# Patient Record
Sex: Female | Born: 1970
Health system: Southern US, Community
[De-identification: ages and names within clinical notes are randomized; demographics above are authoritative.]

## PROBLEM LIST (undated history)

## (undated) HISTORY — PX: LAPAROSCOPIC GASTRIC BYPASS: SUR771

---
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
## 1998-06-06 HISTORY — PX: CHOLECYSTECTOMY: SHX55

## 1998-07-07 HISTORY — PX: TONSILLECTOMY: SUR1361

## 2004-08-25 ENCOUNTER — Ambulatory Visit: Payer: Self-pay | Admitting: Otolaryngology

## 2005-01-03 ENCOUNTER — Ambulatory Visit: Payer: Self-pay | Admitting: Family Medicine

## 2005-01-11 ENCOUNTER — Ambulatory Visit: Payer: Self-pay | Admitting: Family Medicine

## 2005-01-24 ENCOUNTER — Ambulatory Visit: Payer: Self-pay | Admitting: Family Medicine

## 2005-06-06 HISTORY — PX: ENDOMETRIAL ABLATION: SHX621

## 2005-11-18 ENCOUNTER — Ambulatory Visit: Payer: Self-pay | Admitting: Obstetrics and Gynecology

## 2007-01-22 ENCOUNTER — Ambulatory Visit: Payer: Self-pay | Admitting: Obstetrics and Gynecology

## 2007-07-19 IMAGING — RF DG BARIUM SWALLOW
1 series · 15 of 23 positions shown · non-contrast
Comparison: none

REASON FOR EXAM: Esophageal reflux
COMMENTS:

[Series 1: run · 13 acquisitions, 15 frames shown]
[im 1/13]
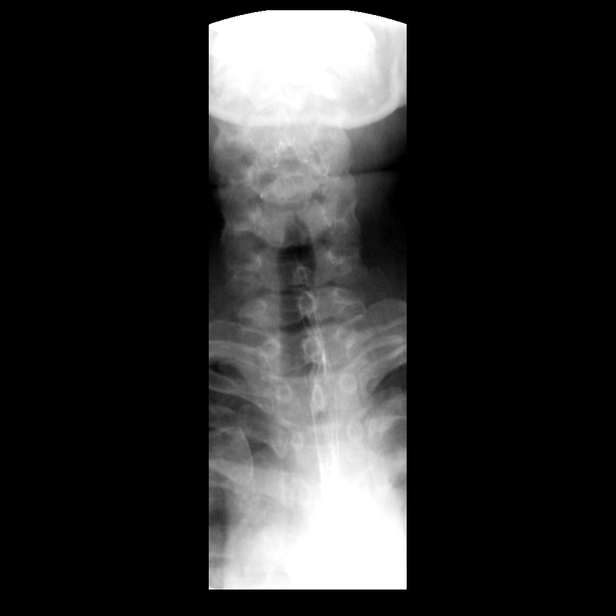
[im 1/13]
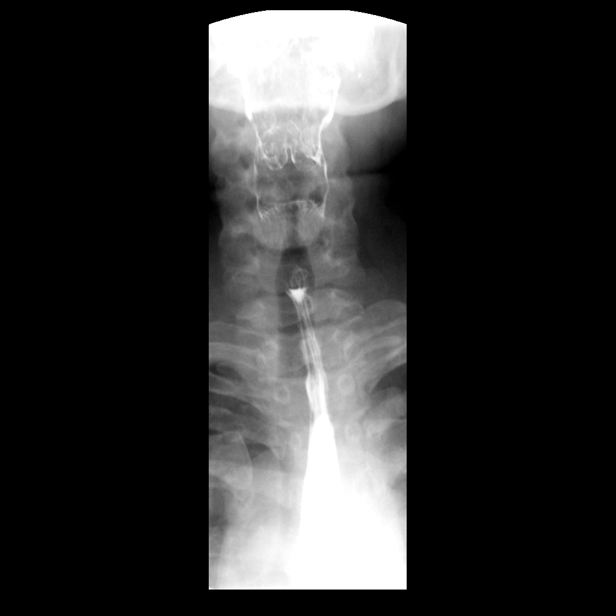
[im 2/13]
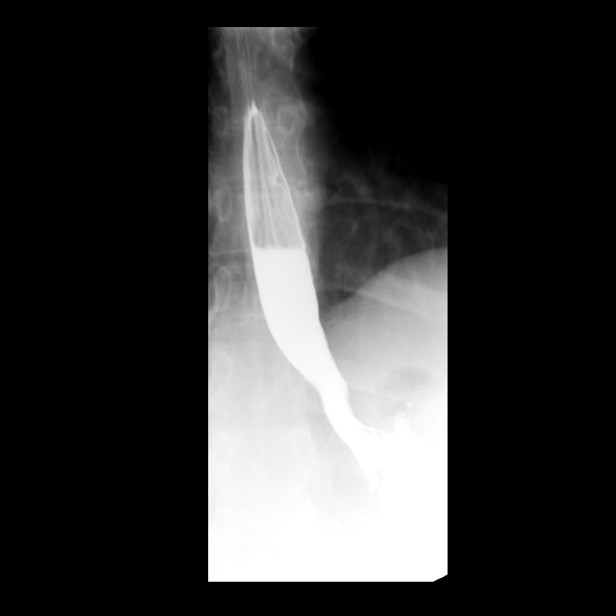
[im 3/13]
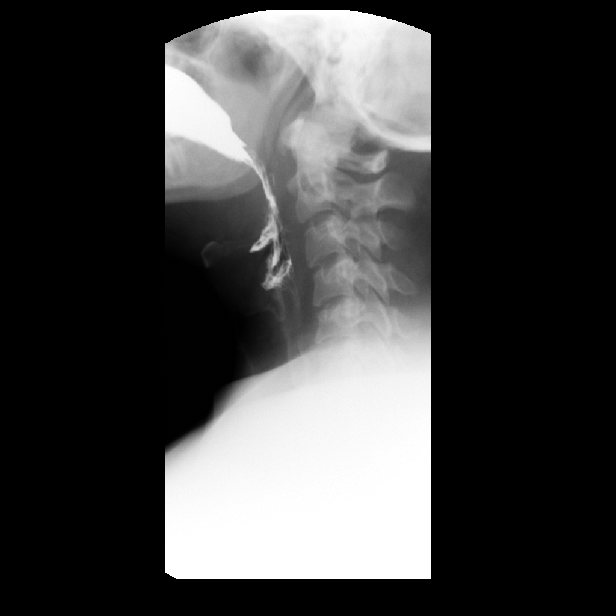
[im 3/13]
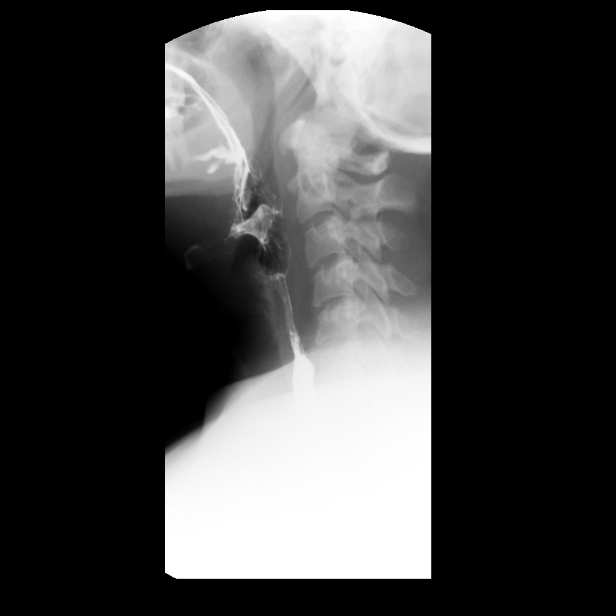
[im 4/13]
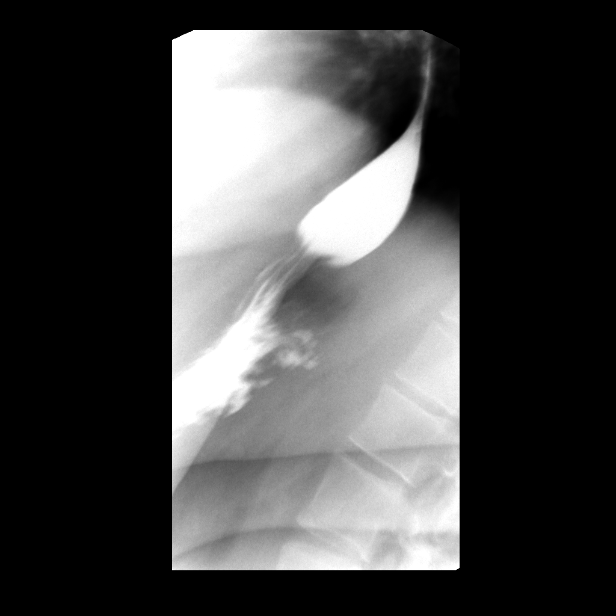
[im 4/13]
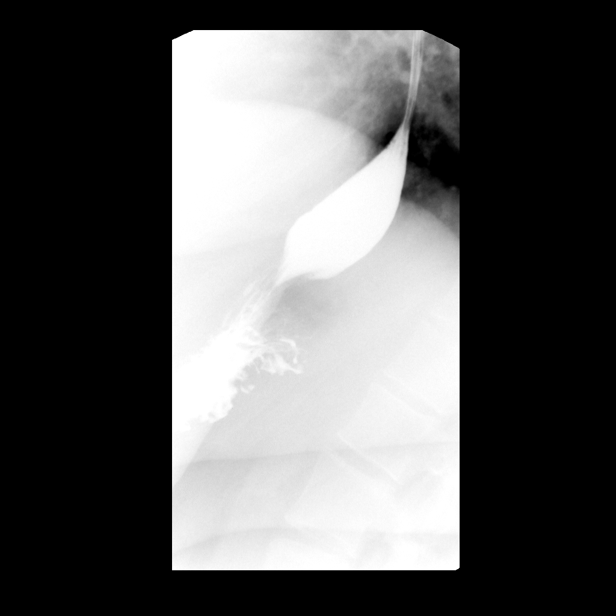
[im 5/13]
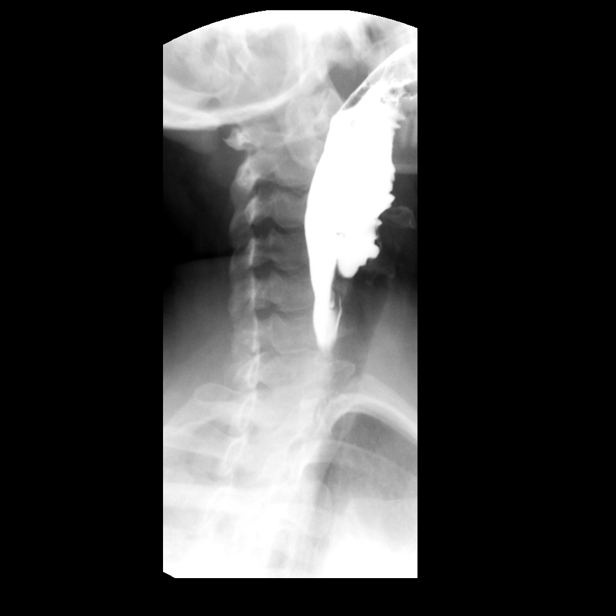
[im 6/13]
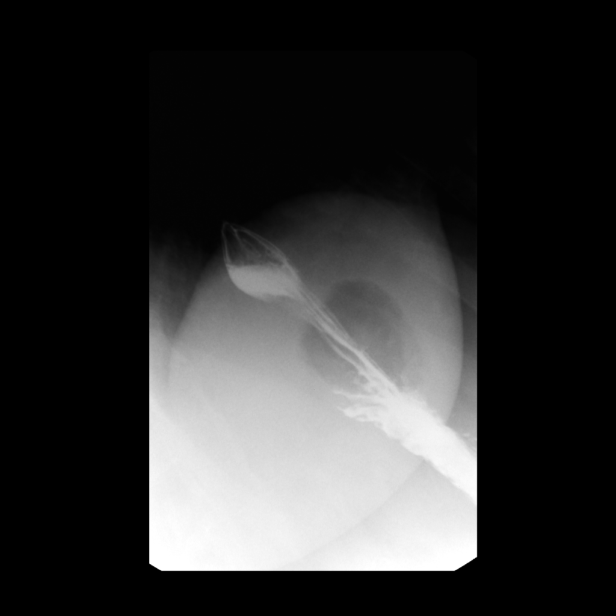
[im 7/13]
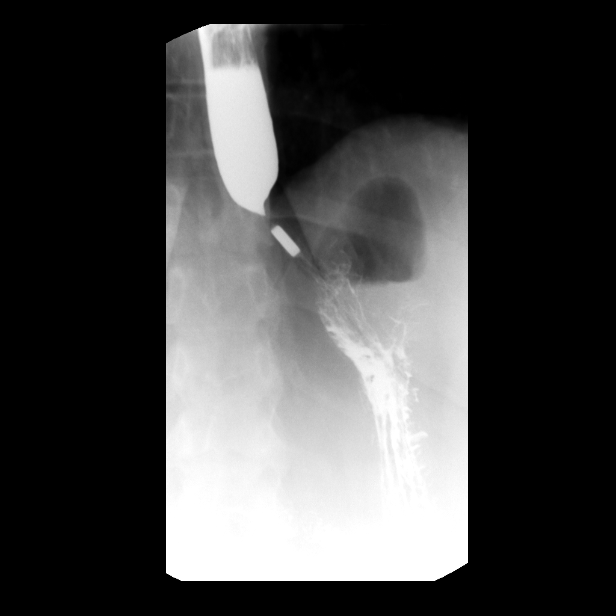
[im 7/13]
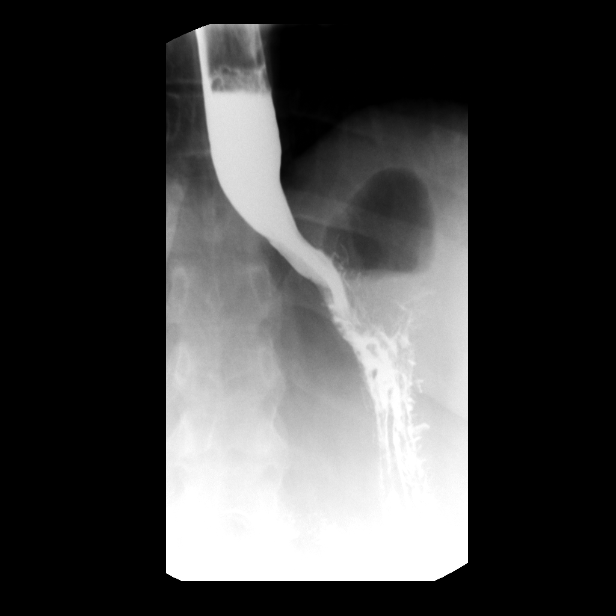
[im 8/13]
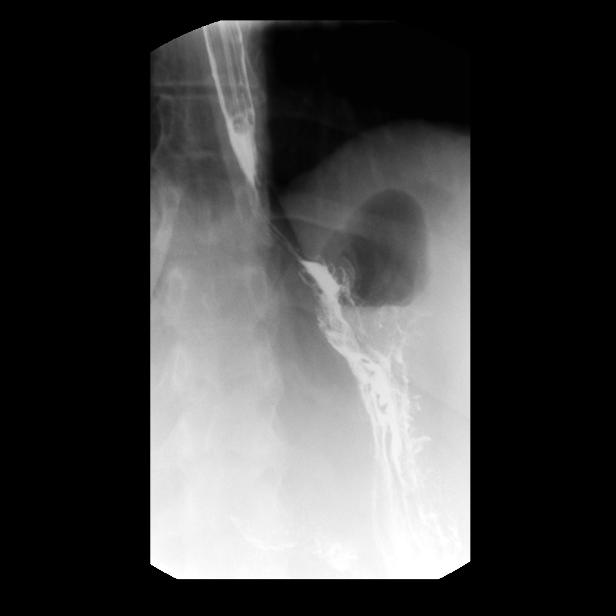
[im 10/13]
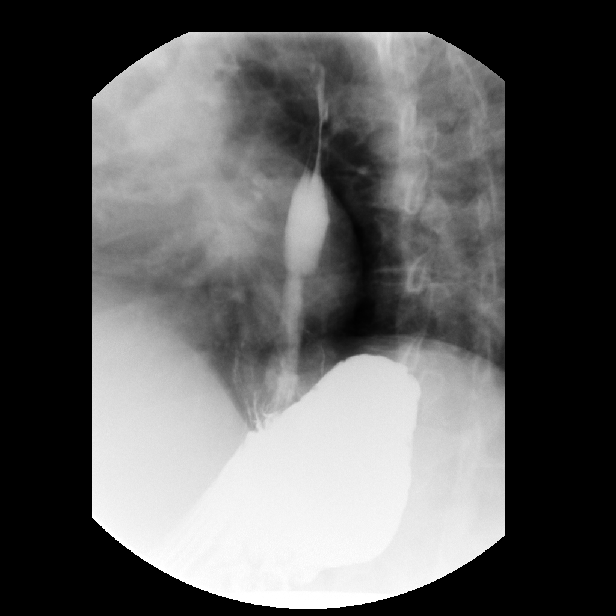
[im 11/13]
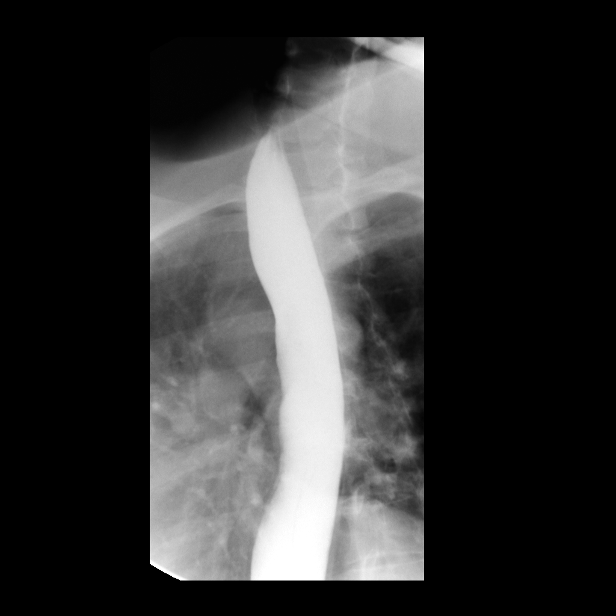
[im 13/13]
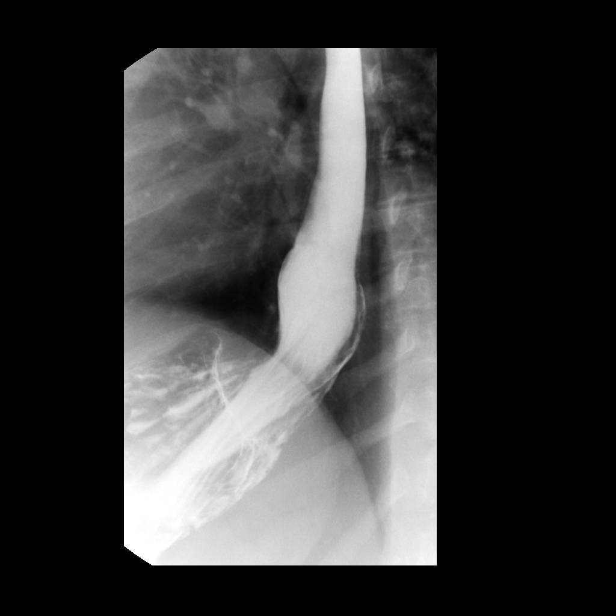

[15 of 23 positions shown; findings below may reference images not displayed]

PROCEDURE:     FL  - FL BARIUM SWALLOW  - January 03, 2005 [DATE]

RESULT:     The patient easily ingested the liquid barium.  The esophageal
mucosa and morphology appear normal.  There is no aspiration or penetration
of barium into the upper airway.  A 12.5 mm barium impregnated tablet passed
easily through the esophagus.  The tablet did pause at the gastroesophageal
junction region and did not pass with additional swallows of water but with
additional swallows of barium the tablet passed into the stomach.  There is
gastroesophageal reflux with the patient in the RIGHT posterior oblique
position into the mid to proximal esophagus.  No hiatal hernia could be
identified.
IMPRESSION: No severe stenosis.  There may be some mild spasm in the distal esophagus.

Findings consistent with gastroesophageal reflux.  No definite hiatal hernia
was seen.

## 2007-07-19 IMAGING — US ABDOMEN ULTRASOUND
1 series · 17 of 25 positions shown · non-contrast
Comparison: none

REASON FOR EXAM: Eval Gallstones Abd Pain
COMMENTS:

[Series 1: abdomen ultrasound · 17 of 59 slices shown]
[im 1/59]
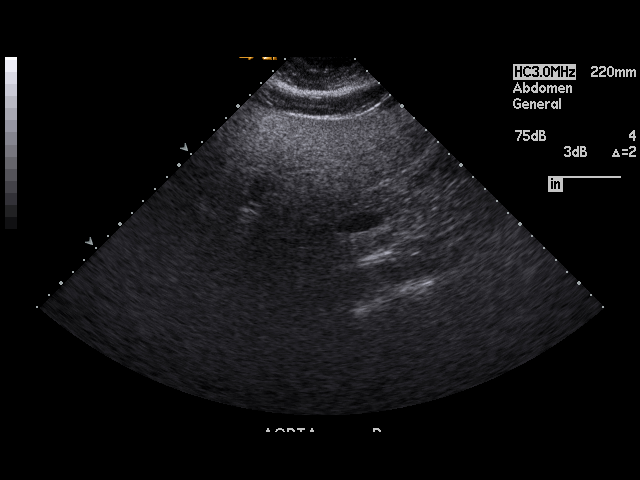
[im 5/59]
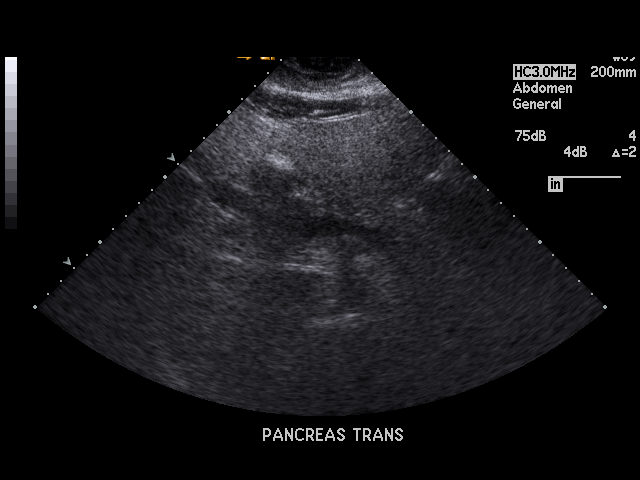
[im 8/59]
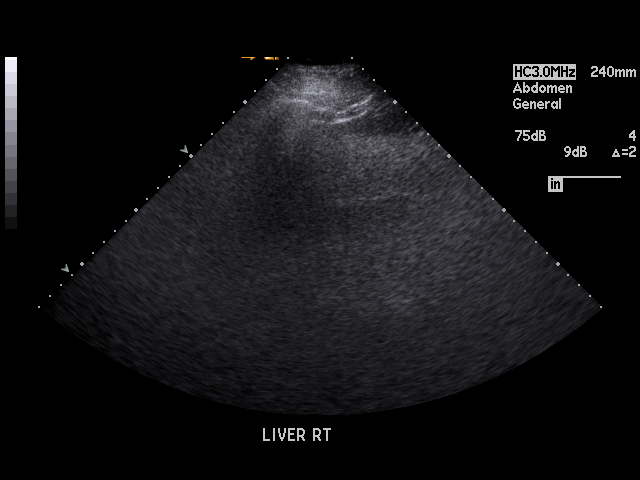
[im 13/59]
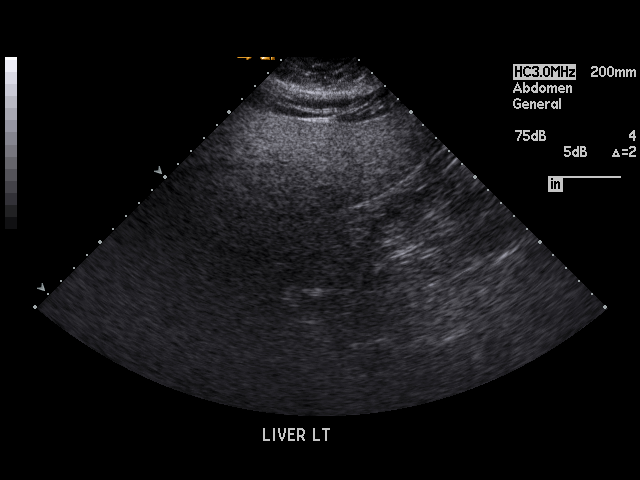
[im 15/59]
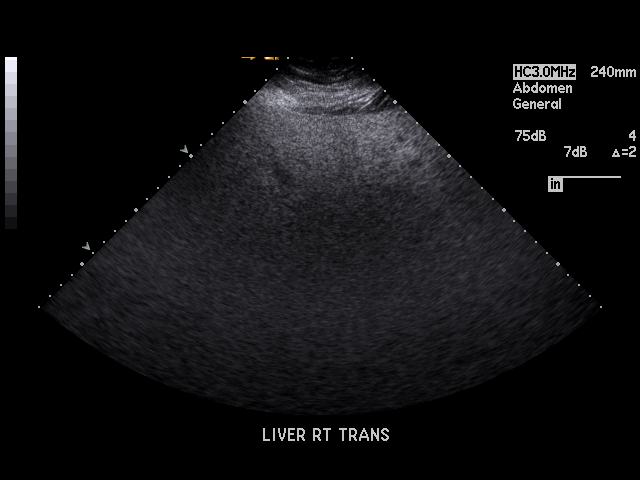
[im 20/59]
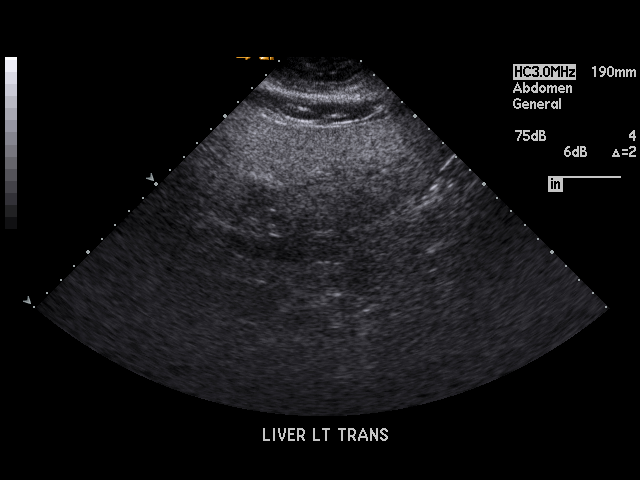
[im 22/59]
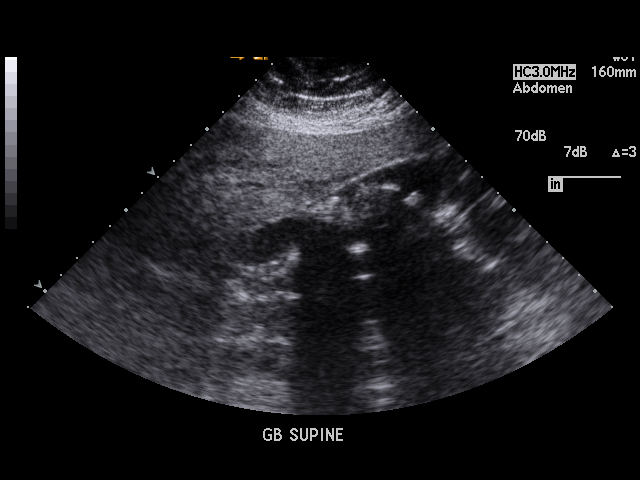
[im 27/59]
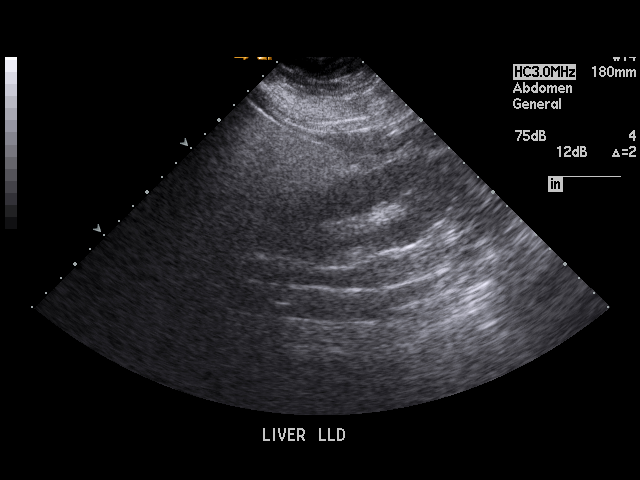
[im 30/59]
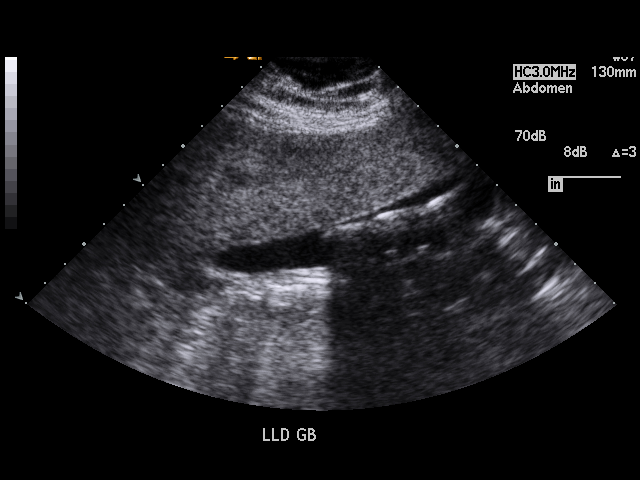
[im 32/59]
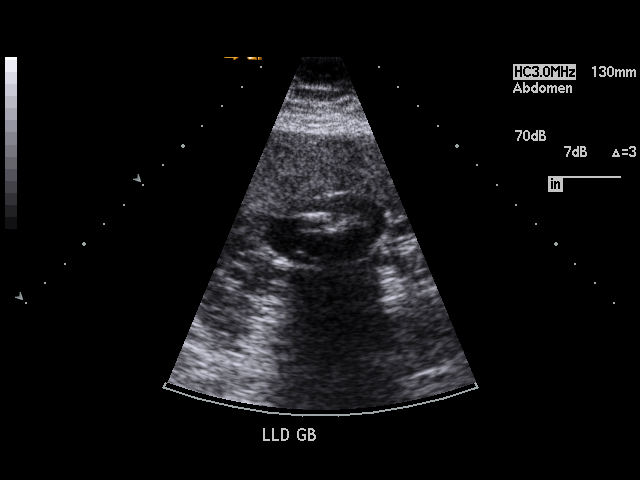
[im 37/59]
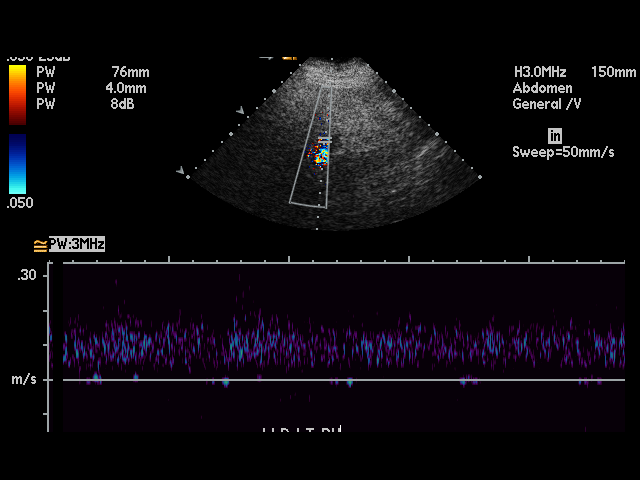
[im 39/59]
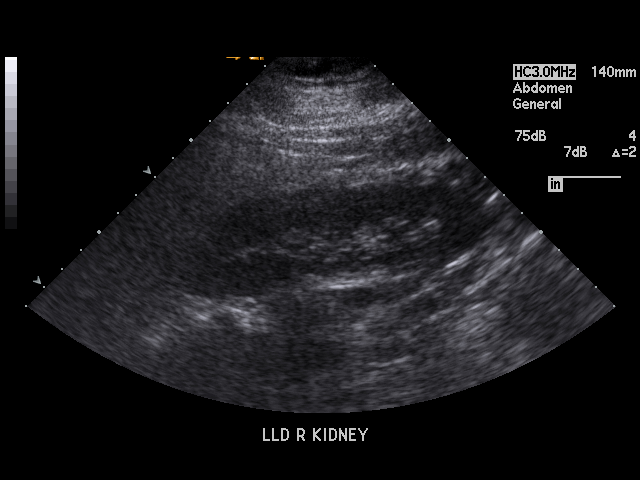
[im 44/59]
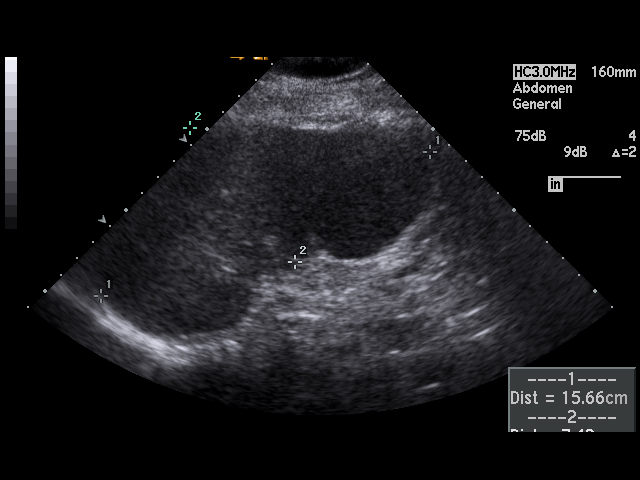
[im 46/59]
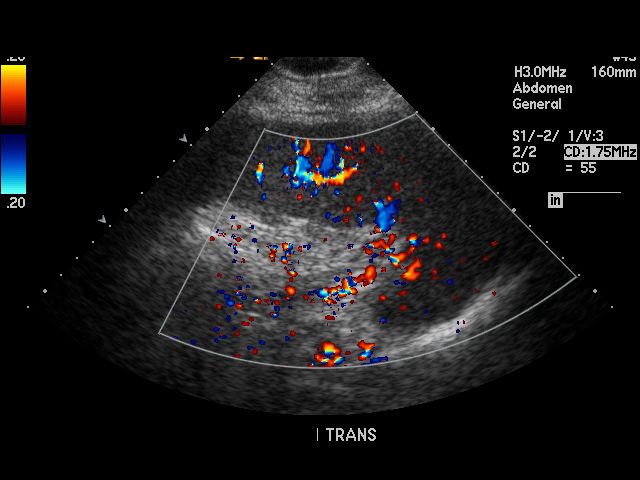
[im 51/59]
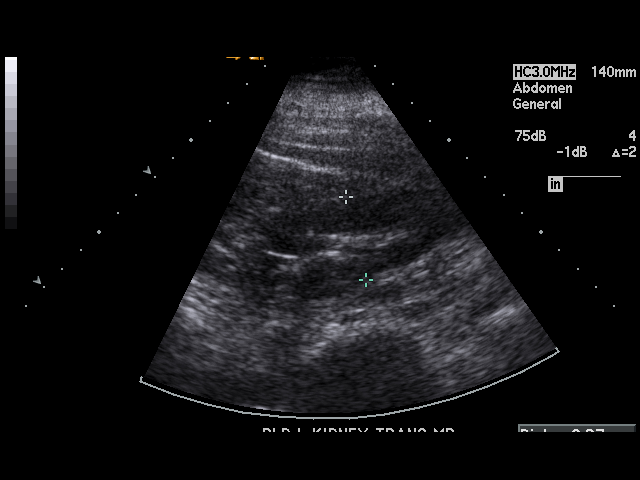
[im 54/59]
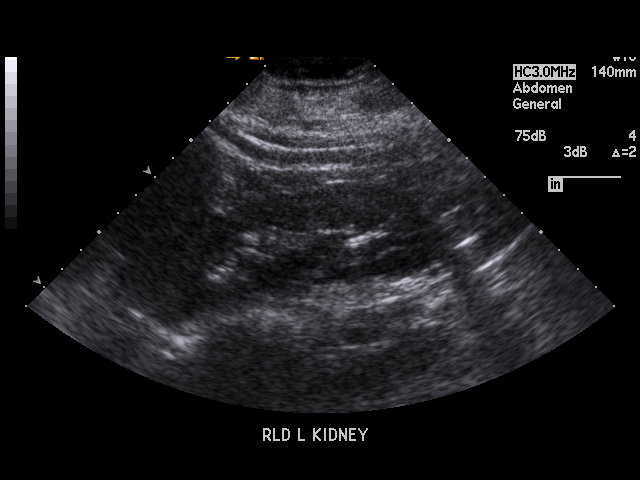
[im 59/59]
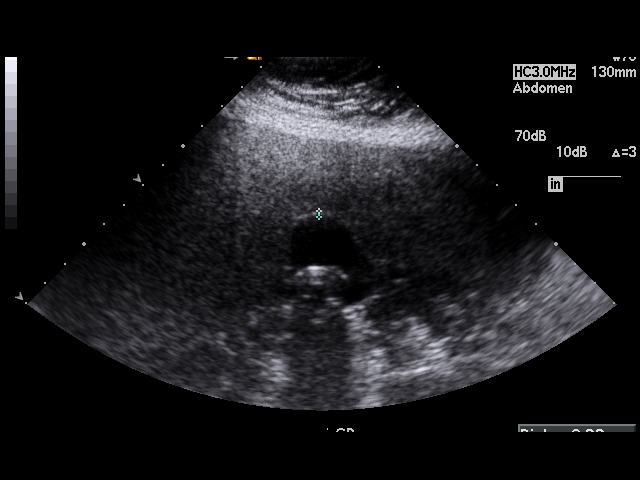

[17 of 25 positions shown; findings below may reference images not displayed]

PROCEDURE:     US  - US ABDOMEN GENERAL SURVEY  - January 03, 2005  [DATE]

RESULT:        Sonographic evaluation of the abdomen is performed in the
standard fashion.   The gallbladder demonstrates multiple stones present.
The patient has a positive sonographic Murphy sign.   Gallbladder wall
thickness is 2.2 mm.  The common bile duct diameter is up to 6.3 mm.   The
kidneys appear to be grossly normal.   The spleen appears to be somewhat
enlarged measuring up to 15.6 cm longitudinally.   No focal splenic masses
are evident.   The liver appears somewhat hyperechoic suggestive of diffuse
fatty infiltration or chronic hepatocellular disease.  The portal vein flow
is normal.   The aorta is limited in visibility but appears grossly normal.
The pancreas could not be seen.
IMPRESSION: 1.     Cholelithiasis.   The patient reports focal tenderness in the RIGHT
upper quadrant during scanning.
2.     The common bile duct is significantly distended measuring up to
mm.
3.     Splenomegaly.
4.     Nonvisualization of the pancreas.
5.     Hyperechogenicity of the liver which may be secondary to diffuse
fatty infiltration.   No focal mass is seen.

## 2013-08-30 ENCOUNTER — Ambulatory Visit: Payer: Self-pay | Admitting: Obstetrics and Gynecology

## 2013-09-09 ENCOUNTER — Ambulatory Visit: Payer: Self-pay | Admitting: Obstetrics and Gynecology

## 2013-09-10 LAB — PATHOLOGY REPORT

## 2014-05-15 ENCOUNTER — Ambulatory Visit: Payer: Self-pay | Admitting: Obstetrics and Gynecology

## 2016-03-14 IMAGING — US US BREAST*R* LIMITED INC AXILLA
1 series · 5 of 5 positions shown · non-contrast
Comparison: 01/22/2007

ADDENDUM:
Patient returned for possible ultrasound-guided core biopsy of the
right breast at 4 o'clock position 12 cm from the nipple. On repeat
ultrasound evaluation at the time of the ultrasound core biopsy a
definite shadowing mass could not be seen. Most likely the
appearance noted on the recent ultrasound was related to shadowing
adjacent to a fat lobule. On my directed physical examination today,
a discrete palpable mass is not present on today's examination and
the area of questionable nodularity on physical examination on
mammography is in an area of fatty tissue. As result, the right
breast ultrasound-guided core biopsy has been canceled. The patient
has been instructed to perform monthly breast self-examination and
contact us if on her physical examination the area becomes more
prominent. I recommend followup right breast diagnostic mammogram
with ultrasound in 6 months.

3: Probably benign finding(s) - short interval follow-up suggested.
CLINICAL DATA: 43-year-old female for annual bilateral mammograms
and palpable mass in the inner lower right breast discovered on
self-examination.
EXAM:
DIGITAL DIAGNOSTIC  BILATERAL MAMMOGRAM WITH CAD
ULTRASOUND RIGHT BREAST

[Series 1: us breast*right* limited inc axilla · 0.08mm/px · 5 of 5 slices shown]
[im 1/5]
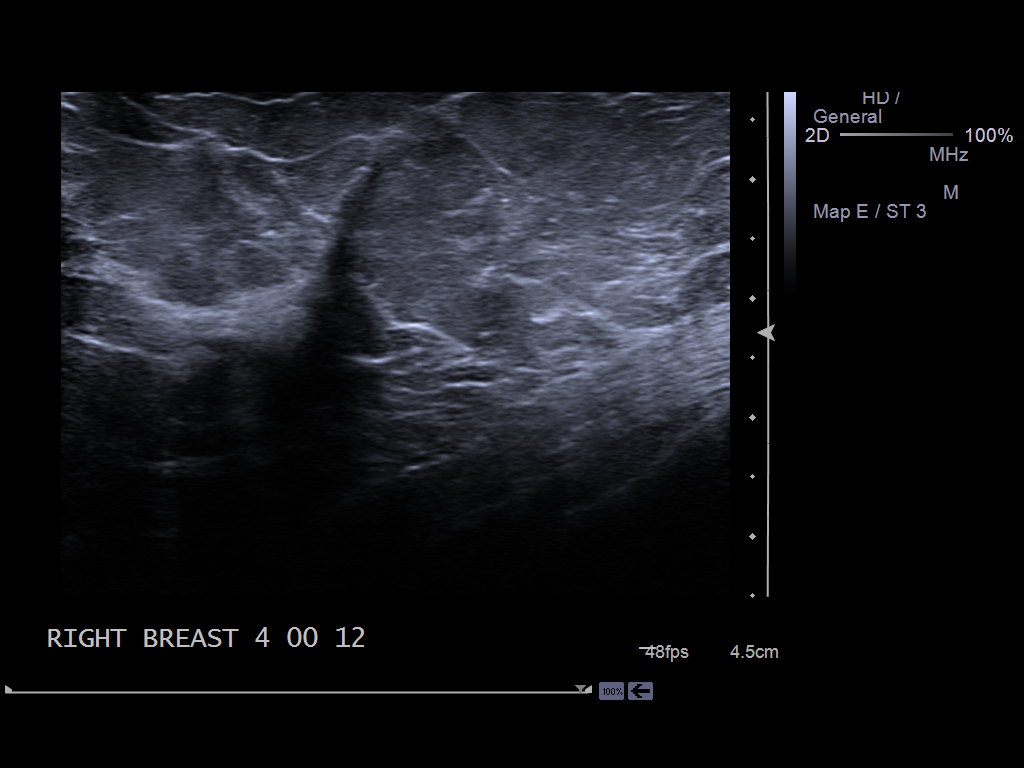
[im 2/5]
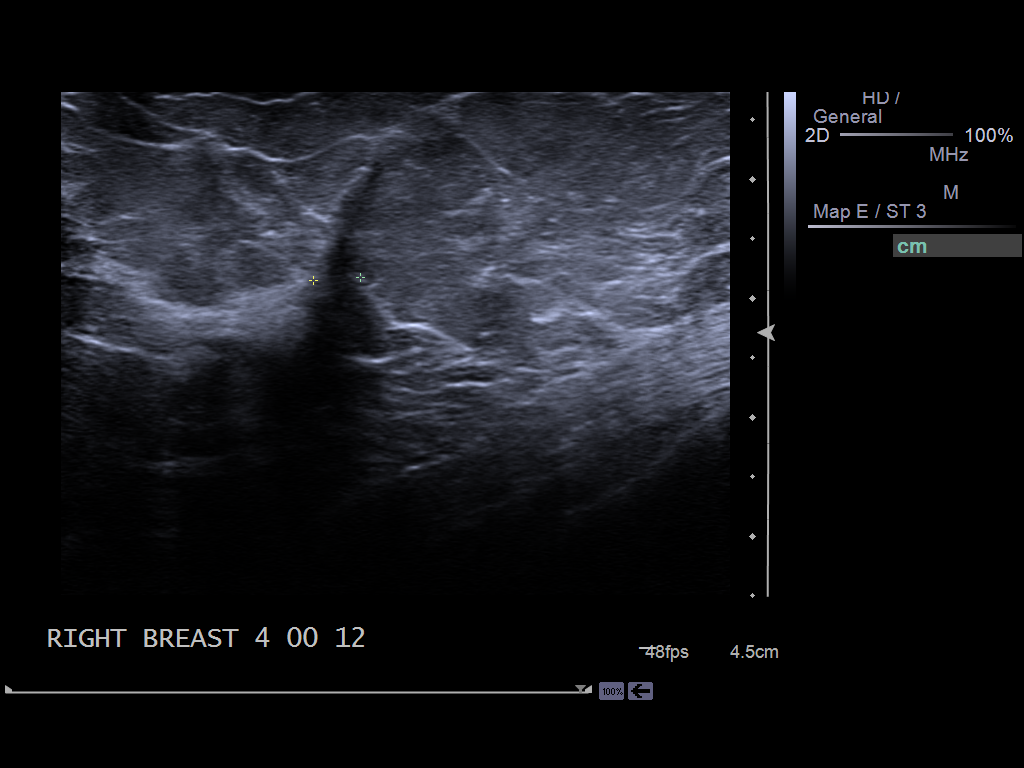
[im 3/5]
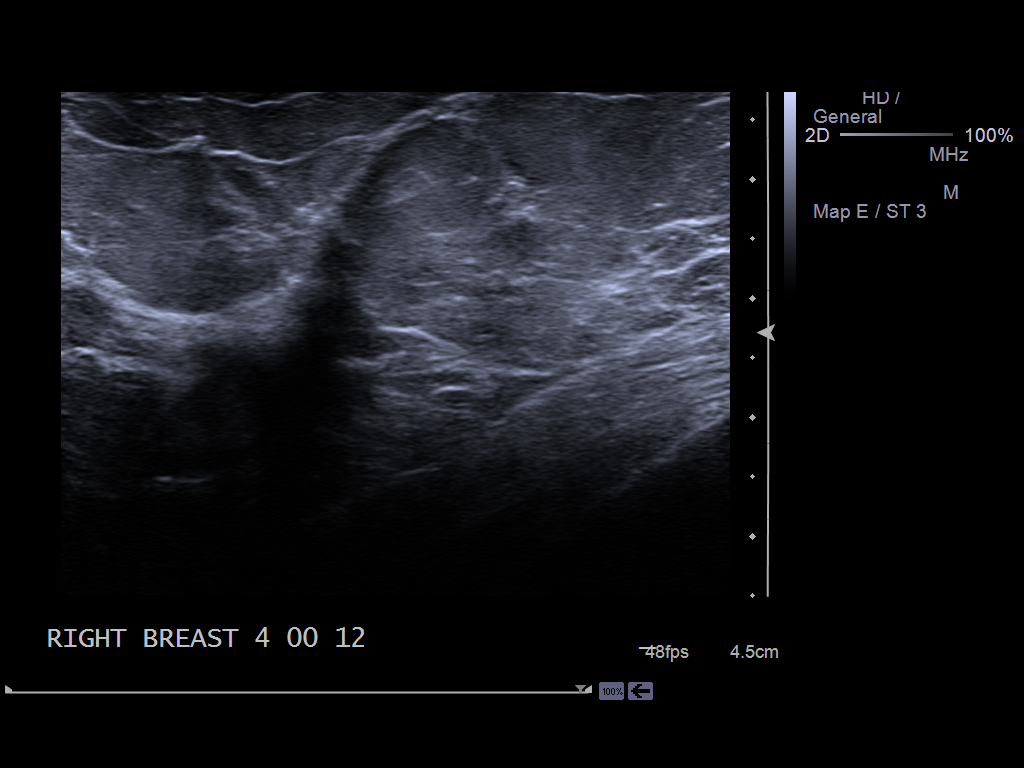
[im 4/5]
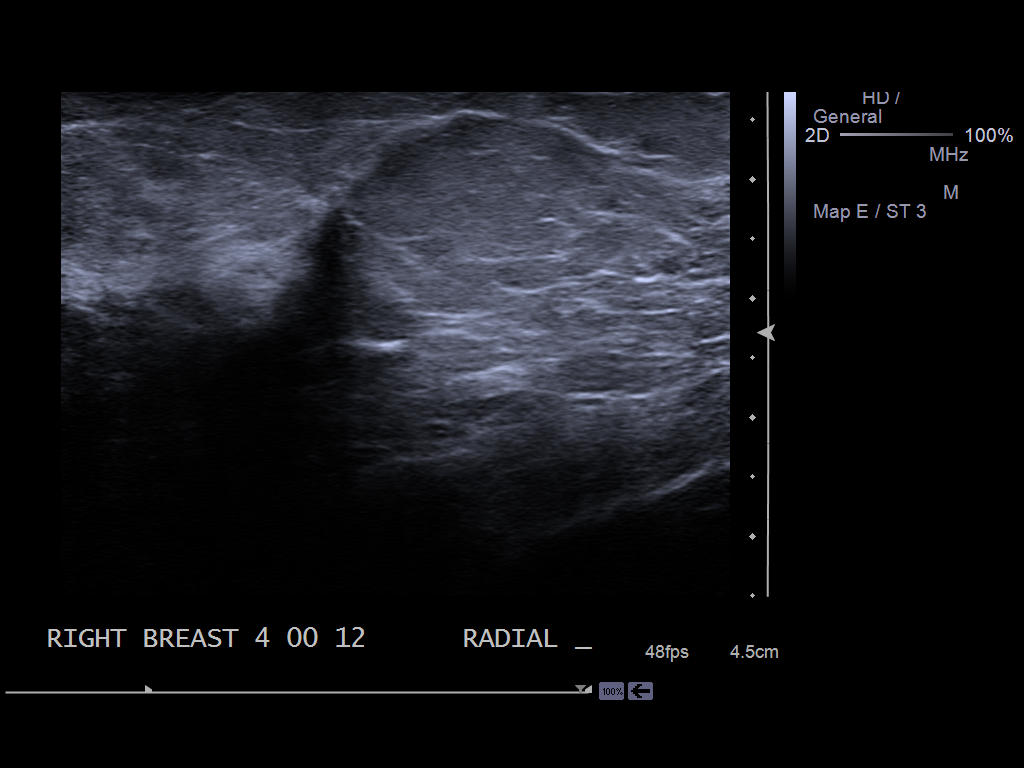
[im 5/5]
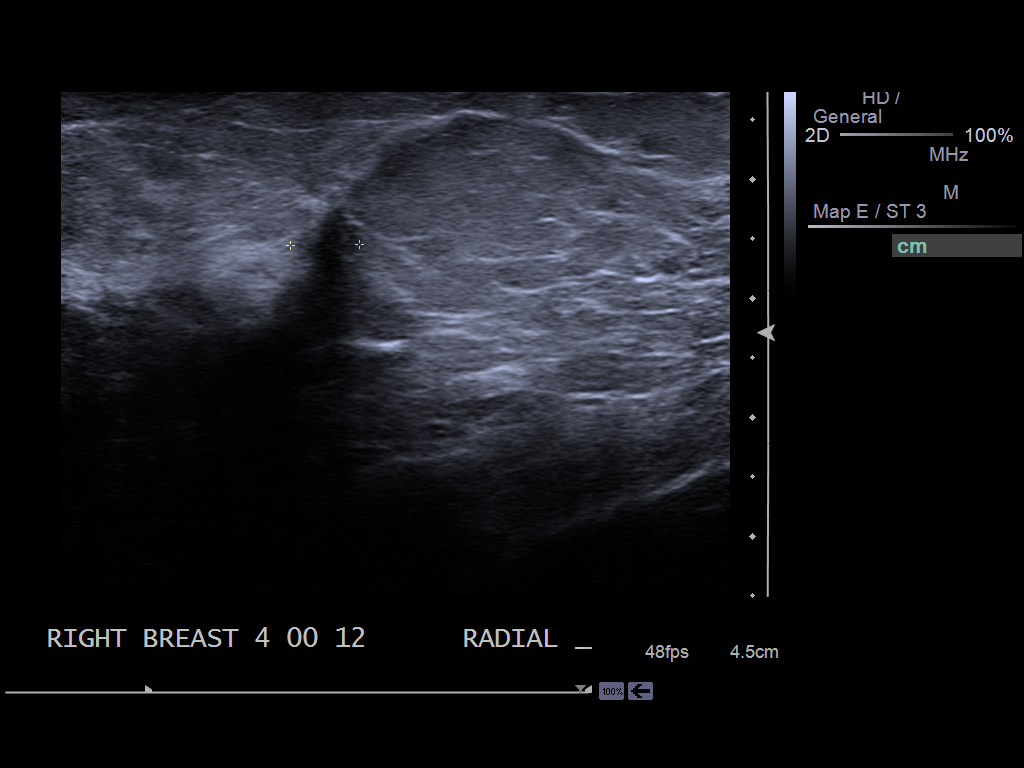

[5 of 5 positions shown; findings below may reference images not displayed]

ACR Breast Density Category c: The breast tissue is heterogeneously
dense, which may obscure small masses.
FINDINGS: Spot compression views of the right breast, magnification views of
the left breast and routine views of both breasts performed.

A new 2 x 5 x 2 mm group of heterogeneous calcifications is
identified within the central left breast.

There is no evidence of suspicious mass or distortion bilaterally.

Mammographic images were processed with CAD.

On physical exam, there is a discrete focal firm 6 mm mass at the 4
o'clock position of the right breast 12 cm from the nipple.

Ultrasound is performed, showing a 6 mm area of hypoechoic shadowing
at the 4 o'clock position of the right breast 12 cm from the nipple,
directly underlying the palpable mass. There are other areas of
hypoechoic shadowing within the right breast that have a similar
appearance and likely represent normal tissue. However, given that
this area at the 4 o'clock position corresponds to the palpable
nodule, tissue sampling is recommended.
IMPRESSION: 6 mm hypoechoic shadowing within the inner lower right breast which
directly corresponds to a palpable mass. Tissue sampling is
recommended.

New indeterminate central left breast calcifications -stereotactic
biopsy is recommended.

RECOMMENDATION:
Ultrasound-guided right breast biopsy and stereotactic guided left
breast biopsy. These procedures will be arranged by our department
and the patient informed.

I have discussed the findings and recommendations with the patient.
Results were also provided in writing at the conclusion of the
visit. If applicable, a reminder letter will be sent to the patient
regarding the next appointment.

BI-RADS CATEGORY  4: Suspicious abnormality - biopsy should be
considered.

## 2016-05-12 ENCOUNTER — Other Ambulatory Visit: Payer: Self-pay | Admitting: Obstetrics and Gynecology

## 2016-05-12 DIAGNOSIS — Z1231 Encounter for screening mammogram for malignant neoplasm of breast: Secondary | ICD-10-CM

## 2016-06-17 ENCOUNTER — Ambulatory Visit: Payer: Self-pay | Attending: Obstetrics and Gynecology

## 2016-11-07 ENCOUNTER — Encounter: Payer: Self-pay | Admitting: Emergency Medicine

## 2016-11-07 ENCOUNTER — Emergency Department
Admission: EM | Admit: 2016-11-07 | Discharge: 2016-11-07 | Disposition: A | Discharge status: Home / Self Care | Payer: 59 | Attending: Emergency Medicine | Admitting: Emergency Medicine

## 2016-11-07 DIAGNOSIS — R197 Diarrhea, unspecified: Secondary | ICD-10-CM | POA: Insufficient documentation

## 2016-11-07 DIAGNOSIS — R112 Nausea with vomiting, unspecified: Secondary | ICD-10-CM | POA: Diagnosis present

## 2016-11-07 LAB — COMPREHENSIVE METABOLIC PANEL
ALK PHOS: 66 U/L (ref 38–126)
ALT: 15 U/L (ref 14–54)
AST: 18 U/L (ref 15–41)
Albumin: 4.3 g/dL (ref 3.5–5.0)
Anion gap: 10 (ref 5–15)
BILIRUBIN TOTAL: 0.7 mg/dL (ref 0.3–1.2)
BUN: 16 mg/dL (ref 6–20)
CO2: 24 mmol/L (ref 22–32)
Calcium: 9.4 mg/dL (ref 8.9–10.3)
Chloride: 103 mmol/L (ref 101–111)
Creatinine, Ser: 0.73 mg/dL (ref 0.44–1.00)
GFR calc Af Amer: >60 mL/min (ref >60)
GFR calc non Af Amer: >60 mL/min (ref >60)
Glucose, Bld: 138 mg/dL — ABNORMAL HIGH (ref 65–99)
POTASSIUM: 4.1 mmol/L (ref 3.5–5.1)
SODIUM: 137 mmol/L (ref 135–145)
TOTAL PROTEIN: 8.1 g/dL (ref 6.5–8.1)

## 2016-11-07 LAB — URINALYSIS, COMPLETE (UACMP) WITH MICROSCOPIC
Bilirubin Urine: NEGATIVE
Glucose, UA: NEGATIVE mg/dL
Hgb urine dipstick: NEGATIVE
KETONES UR: 5 mg/dL — AB
Leukocytes, UA: NEGATIVE
NITRITE: NEGATIVE
PROTEIN: 30 mg/dL — AB
Specific Gravity, Urine: 1.03 (ref 1.005–1.030)
pH: 5 (ref 5.0–8.0)

## 2016-11-07 LAB — LIPASE, BLOOD: Lipase: 20 U/L (ref 11–51)

## 2016-11-07 LAB — CBC
HEMATOCRIT: 41 % (ref 35.0–47.0)
Hemoglobin: 14.1 g/dL (ref 12.0–16.0)
MCH: 29.2 pg (ref 26.0–34.0)
MCHC: 34.3 g/dL (ref 32.0–36.0)
MCV: 85.2 fL (ref 80.0–100.0)
Platelets: 201 10*3/uL (ref 150–440)
RBC: 4.82 MIL/uL (ref 3.80–5.20)
RDW: 14.9 % — AB (ref 11.5–14.5)
WBC: 13.3 10*3/uL — ABNORMAL HIGH (ref 3.6–11.0)

## 2016-11-07 MED ORDER — SODIUM CHLORIDE 0.9 % IV BOLUS (SEPSIS)
1000.0000 mL | Freq: Once | INTRAVENOUS | Status: AC
  Administered 2016-11-07: 1000 mL via INTRAVENOUS

## 2016-11-07 MED ORDER — ONDANSETRON HCL 4 MG/2ML IJ SOLN
4.0000 mg | Freq: Once | INTRAMUSCULAR | Status: AC
  Administered 2016-11-07: 4 mg via INTRAVENOUS
  Filled 2016-11-07: qty 2

## 2016-11-07 MED ORDER — ONDANSETRON HCL 4 MG PO TABS: 4.0000 mg | ORAL_TABLET | Freq: Three times a day (TID) | ORAL | 0 refills | Status: AC | PRN

## 2016-11-07 MED ORDER — DICYCLOMINE HCL 20 MG PO TABS: 20.0000 mg | ORAL_TABLET | Freq: Three times a day (TID) | ORAL | 0 refills | Status: AC | PRN

## 2016-11-07 NOTE — ED Triage Notes (Signed)
States nausea and belching that began last night, states vomiting and diarrhea and abd "wave like convulsions" since last night, at present states left lower back/flank pain, awake and alert in no acute distress

## 2016-11-07 NOTE — ED Provider Notes (Signed)
[facility] Emergency Department Provider Note   ____________________________________________   I have reviewed the triage vital signs and the nursing notes.   HISTORY  Chief Complaint Vomiting and Diarrhea   History limited by: Not Limited   HPI Nicole Luna is a 46 y.o. female who presents to the emergency department today because of concerns for vomiting diarrhea and abdominal pain. She states that the vomiting and diarrhea started last evening. Started couple hours after meal at [employer]. She is on multiple episodes of nonbloody vomiting and nonbloody diarrhea. Says also been accompanied by some abdominal pain. Is located in the left side. It is described as sharp. It does come and go. She does not have any known sick contacts.   History reviewed. No pertinent past medical history.  There are no active problems to display for this patient.   History reviewed. No pertinent surgical history.  Prior to Admission medications   Not on File    Allergies Penicillins  History reviewed. No pertinent family history.  Social History Social History  Substance Use Topics  . Smoking status: Not on file  . Smokeless tobacco: Not on file  . Alcohol use Not on file    Review of Systems Constitutional: No fever/chills Eyes: No visual changes. ENT: No sore throat. Cardiovascular: Denies chest pain. Respiratory: Denies shortness of breath. Gastrointestinal: Positive for abdominal pain, nausea, vomiting and diarrhea.  Genitourinary: Negative for dysuria. Musculoskeletal: Negative for back pain. Skin: Negative for rash. Neurological: Negative for headaches, focal weakness or numbness.  ____________________________________________   PHYSICAL EXAM:  VITAL SIGNS: ED Triage Vitals  Enc Vitals Group     BP 11/07/16 1407 121/67     Pulse Rate 11/07/16 1407 94     Resp 11/07/16 1407 20     Temp 11/07/16 1407 100.1 F (37.8 C)     Temp  Source 11/07/16 1407 Oral     SpO2 11/07/16 1407 97 %     Weight 11/07/16 1408 227 lb (103 kg)     Height 11/07/16 1408 5\' 4"  (1.626 m)     Head Circumference --      Peak Flow --      Pain Score 11/07/16 1416 9     Pain Loc --      Pain Edu? --      Excl. in GC? --      Constitutional: Alert and oriented. Well appearing and in no distress. Eyes: Conjunctivae are normal.  ENT   Head: Normocephalic and atraumatic.   Nose: No congestion/rhinnorhea.   Mouth/Throat: Mucous membranes are moist.   Neck: No stridor. Hematological/Lymphatic/Immunilogical: No cervical lymphadenopathy. Cardiovascular: Normal rate, regular rhythm.  No murmurs, rubs, or gallops. Respiratory: Normal respiratory effort without tachypnea nor retractions. Breath sounds are clear and equal bilaterally. No wheezes/rales/rhonchi. Gastrointestinal: Soft and slightly tender diffusely.  No rebound. No guarding.  Genitourinary: Deferred Musculoskeletal: Normal range of motion in all extremities. No lower extremity edema. Neurologic:  Normal speech and language. No gross focal neurologic deficits are appreciated.  Skin:  Skin is warm, dry and intact. No rash noted. Psychiatric: Mood and affect are normal. Speech and behavior are normal. Patient exhibits appropriate insight and judgment.  ____________________________________________    LABS (pertinent positives/negatives)  Labs Reviewed  COMPREHENSIVE METABOLIC PANEL - Abnormal; Notable for the following:       Result Value   Glucose, Bld 138 (*)    All other components within normal limits  CBC - Abnormal; Notable for  the following:    WBC 13.3 (*)    RDW 14.9 (*)    All other components within normal limits  URINALYSIS, COMPLETE (UACMP) WITH MICROSCOPIC - Abnormal; Notable for the following:    Color, Urine AMBER (*)    APPearance HAZY (*)    Ketones, ur 5 (*)    Protein, ur 30 (*)    Bacteria, UA RARE (*)    Squamous Epithelial / LPF 0-5 (*)     All other components within normal limits  LIPASE, BLOOD     ____________________________________________   EKG  None  ____________________________________________    RADIOLOGY  None  ____________________________________________   PROCEDURES  Procedures  ____________________________________________   INITIAL IMPRESSION / ASSESSMENT AND PLAN / ED COURSE  Pertinent labs & imaging results that were available during my care of the patient were reviewed by me and considered in my medical decision making (see chart for details).  Patient presented to the emergency department today with his concerns for nausea vomiting and diarrhea. This would think food poisoning or viral illness likely. Patient did feel better after IV fluids and antibiotics. Will discharge home with antiemetics and bentyl.  ____________________________________________   FINAL CLINICAL IMPRESSION(S) / ED DIAGNOSES  Final diagnoses:  Nausea vomiting and diarrhea     Note: This dictation was prepared with Dragon dictation. Any transcriptional errors that result from this process are unintentional     Phineas Semen, MD 11/07/16 2144

## 2016-11-07 NOTE — ED Notes (Signed)
Pt given sprite for PO challenge, pt has tolerated sprite at this time with no emesis occurrence.

## 2016-11-07 NOTE — Discharge Instructions (Signed)
Please seek medical attention for any high fevers, chest pain, shortness of breath, change in behavior, persistent vomiting, bloody stool or any other new or concerning symptoms.  

## 2017-08-22 DIAGNOSIS — M722 Plantar fascial fibromatosis: Secondary | ICD-10-CM | POA: Diagnosis not present

## 2017-09-12 DIAGNOSIS — E119 Type 2 diabetes mellitus without complications: Secondary | ICD-10-CM | POA: Diagnosis not present

## 2018-04-10 DIAGNOSIS — Z0000 Encounter for general adult medical examination without abnormal findings: Secondary | ICD-10-CM | POA: Diagnosis not present

## 2018-04-10 DIAGNOSIS — Z7689 Persons encountering health services in other specified circumstances: Secondary | ICD-10-CM | POA: Diagnosis not present

## 2018-04-10 DIAGNOSIS — Z1231 Encounter for screening mammogram for malignant neoplasm of breast: Secondary | ICD-10-CM | POA: Diagnosis not present

## 2018-04-25 DIAGNOSIS — R7303 Prediabetes: Secondary | ICD-10-CM | POA: Diagnosis not present

## 2018-04-25 DIAGNOSIS — Z0000 Encounter for general adult medical examination without abnormal findings: Secondary | ICD-10-CM | POA: Diagnosis not present

## 2018-04-25 DIAGNOSIS — M722 Plantar fascial fibromatosis: Secondary | ICD-10-CM | POA: Diagnosis not present

## 2018-04-25 DIAGNOSIS — Z23 Encounter for immunization: Secondary | ICD-10-CM | POA: Diagnosis not present

## 2018-06-04 DIAGNOSIS — R030 Elevated blood-pressure reading, without diagnosis of hypertension: Secondary | ICD-10-CM | POA: Diagnosis not present

## 2018-06-25 DIAGNOSIS — R030 Elevated blood-pressure reading, without diagnosis of hypertension: Secondary | ICD-10-CM | POA: Diagnosis not present

## 2018-07-07 DIAGNOSIS — R509 Fever, unspecified: Secondary | ICD-10-CM | POA: Diagnosis not present

## 2018-07-09 DIAGNOSIS — R6889 Other general symptoms and signs: Secondary | ICD-10-CM | POA: Diagnosis not present

## 2018-07-09 DIAGNOSIS — J069 Acute upper respiratory infection, unspecified: Secondary | ICD-10-CM | POA: Diagnosis not present

## 2020-05-06 ENCOUNTER — Encounter: Payer: Self-pay | Admitting: Physical Therapy

## 2020-05-06 ENCOUNTER — Ambulatory Visit: Payer: 59 | Attending: Obstetrics and Gynecology | Admitting: Physical Therapy

## 2020-05-06 ENCOUNTER — Other Ambulatory Visit: Payer: Self-pay

## 2020-05-06 DIAGNOSIS — M6281 Muscle weakness (generalized): Secondary | ICD-10-CM | POA: Insufficient documentation

## 2020-05-06 DIAGNOSIS — R278 Other lack of coordination: Secondary | ICD-10-CM | POA: Diagnosis present

## 2020-05-06 NOTE — Therapy (Signed)
[facility] [address] [place], [place], [address] Phone: [phone]   Fax:  [phone]  Physical Therapy Evaluation  Patient Details  Name: Nicole Luna [facility] MRN: [id number] Date of Birth: October 03, 1970 Referring Provider (PT): Christeen Douglas   Encounter Date: 05/06/2020   PT End of Session - 05/06/20 1610    Visit Number 1    Number of Visits 8    Date for PT Re-Evaluation 07/01/20    Authorization Type IE 05/06/2020    PT Start Time 1600    PT Stop Time 1655    PT Time Calculation (min) 55 min    Activity Tolerance Patient tolerated treatment well    Behavior During Therapy [facility] for tasks assessed/performed           History reviewed. No pertinent past medical history.  Past Surgical History:  Procedure Laterality Date  . CHOLECYSTECTOMY  2000  . ENDOMETRIAL ABLATION  2007  . KNEE ARTHROSCOPY  1980  . LAPAROSCOPIC GASTRIC BYPASS    . TONSILLECTOMY  07/1998    There were no vitals filed for this visit.      [facility] PT Assessment - 05/06/20 1607      Assessment   Medical Diagnosis Anterior Vaginal Wall Prolapse    Referring Provider (PT) Christeen Douglas    Onset Date/Surgical Date 06/21/19    Hand Dominance Right    Next MD Visit 03/2021    Prior Therapy None for this dx      Precautions   Precautions Other (comment)   Prolapse     Balance Screen   Has the patient fallen in the past 6 months No             PELVIC HEALTH PHYSICAL THERAPY EVALUATION  SCREENING Red Flags: None Have you had any night sweats? Unexplained weight loss? Saddle anesthesia? Unexplained changes in bowel or bladder habits?  Precautions: None  SUBJECTIVE  Chief Complaint: Patient notes increased muscle cramping throughout body of longstanding duration. Patient notes that she has some minor complaints for urinary incontinence and urgency with standing after prolonged sitting. Patient's main complaint is the feeling of  a bulge when showering and with forward flexion motions during ADLs.  Pertinent History:  Falls Positive for fall on coccyx.  Scoliosis Positive for kyphosis. Pulmonary disease/dysfunction Negative. Surgical history: Positive for see above.   Recent Procedures/Tests/Findings: None of late  Obstetrical History: G2P2 Deliveries: vaginal, long labors with extended pushing Tearing/Episiotomy: G1 episiotomy Birthing position: back  Gynecological History: Hysterectomy: No  Endometriosis: Positive.  Last Menstrual Period: not presently menstruating; occasional spotting Pain with exam: No   Urinary History: Incontinence: Positive for stress and infrequent urge. Onset: 2021 Triggers: coughing/laughing/sneezing; prolonged delaying. Amount: Min.  Fluid Intake: 100 oz H20,  16 oz coffee caffeinated, infrequent juice/milk Nocturia: 1-2x/night Frequency of urination: every 1 hour Pain with urination: Positive for pain with prolonged delaying (dull) Difficulty initiating urination: Positive for prolonged delaying. Makes use of Crede's maneuver for complete emptying. Frequent UTI: Negative. Hx of frequent kidney infections in 20-30s.  Gastrointestinal History: [place] Stool Chart: Type 2-3 Frequency of BMs: 2-3x/week with no regularity Pain with defecation: Negative Straining with defecation: Positive for "sometimes"  Sexual activity/pain: Pain with intercourse: Positive for occasional depending on angle.   Initial penetration: No  Deep thrustingYes   Location of pain: no pain at present Current pain:  0/10   Patient Goals:  Prevent progression of prolapse  OBJECTIVE  Mental Status Patient  is oriented to person, place and time.  Recent memory is intact.  Remote memory is intact.  Attention span and concentration are intact.  Expressive speech is intact.  Patient's fund of knowledge is within normal limits for educational level.  POSTURE/OBSERVATIONS:  Lumbar lordosis:  WNL Iliac crest height: appearing equal bilaterally Lumbar lateral shift: appearing negative Pelvic obliquity: appearing negative Leg length discrepancy: appearing negative  GAIT: WNL  RANGE OF MOTION: deferred 2/2 to time constraints   LEFT RIGHT  Lumbar forward flexion (65):      Lumbar extension (30):     Lumbar lateral flexion (25):     Thoracic and Lumbar rotation (30 degrees):       Hip Flexion (0-125):      Hip IR (0-45):     Hip ER (0-45):     Hip Abduction (0-40):     Hip extension (0-15):       STRENGTH: MMT deferred 2/2 to time constraints  RLE LLE  Hip Flexion    Hip Extension    Hip Abduction     Hip Adduction     Hip ER     Hip IR     Knee Extension    Knee Flexion    Dorsiflexion     Plantarflexion (seated)     ABDOMINAL: deferred 2/2 to time constraints Palpation: Diastasis: Scar mobility: Rib flare:  SPECIAL TESTS: deferred 2/2 to time constraints   PHYSICAL PERFORMANCE MEASURES:  STS: WNL   EXTERNAL PELVIC EXAM: deferred 2/2 to time constraints Palpation: Breath coordination: Cued Lengthen: Cued Contraction: Cough:  INTERNAL VAGINAL EXAM: deferred 2/2 to time constraints Introitus Appears:  Skin integrity:  Scar mobility: Strength (PERF):  Symmetry: Palpation: Prolapse:   OUTCOME MEASURES: PFDI Prolapse 8   ASSESSMENT Patient is a 49 year old presenting to clinic with chief complaints of pressure in the pelvis. Upon examination, patient demonstrates deficits in PFM coordination, PFM strength, IAP management as evidenced by increased pressure/bulge with standing forward flexion, urinary urgency with standing after prolonged sitting, UI with coughing/laughing/sneezing. Patient's responses on FOTO outcome measures (8) indicate minimal functional limitations/disability/distress. Patient's progress may be limited due to comorbidities; however, patient's motivation and early intervention are advantageous. Patient was able to achieve  basic understanding of PFM function during today's evaluation and responded positively to educational interventions. Patient will benefit from continued skilled therapeutic intervention to address deficits in PFM coordination, PFM strength, and IAP management in order to prevent progression of POP, increase function, and improve overall QOL.  EDUCATION Patient educated on prognosis, POC, and provided with HEP including: bladder diary. Patient articulated understanding and returned demonstration. Patient will benefit from further education in order to maximize compliance and understanding for long-term therapeutic gains.  TREATMENT Neuromuscular Re-education: Patient educated on primary functions of the pelvic floor including: posture/balance, sexual pleasure, storage and elimination of waste from the body, abdominal cavity closure, and breath coordination.   Objective measurements completed on examination: See above findings.       PT Long Term Goals - 05/07/20 1037      PT LONG TERM GOAL #1   Title Patient will demonstrate independence with HEP in order to maximize therapeutic gains and improve carryover from physical therapy sessions to ADLs in the home and community.    Baseline IE: not initiated    Time 8    Period Weeks    Status New    Target Date 07/01/20      PT LONG TERM GOAL #2  Title Patient will demonstrate independent and coordinated diaphragmatic breathing in supine with a 1:2 breathing pattern for improved down-regulation of the nervous system and improved management of intra-abdominal pressures in order to increase function at home and in the community.    Baseline IE: not demonstrated    Time 8    Period Weeks    Status New    Target Date 07/01/20      PT LONG TERM GOAL #3   Title Patient will demonstrate improved function as evidenced by a score of 0 on FOTO measure for full participation in activities at home and in the community.    Baseline IE: 8    Time 8     Period Weeks    Status New    Target Date 07/01/20      PT LONG TERM GOAL #4   Title Patient will demonstrate circumferential and sequential contraction of >4/5 MMT, > 6 sec hold x10 and 5 consecutive quick flicks with </= 10 min rest between testing bouts, and relaxation of the PFM coordinated with breath for improved management of intra-abdominal pressure and normal bowel and bladder function without the presence of pain nor incontinence in order to improve participation at home and in the community.    Baseline IE: not demonstrated    Time 8    Period Weeks    Status New    Target Date 07/01/20                  Plan - 05/06/20 1611    Clinical Impression Statement Patient is a 49 year old presenting to clinic with chief complaints of pressure in the pelvis. Upon examination, patient demonstrates deficits in PFM coordination, PFM strength, IAP management as evidenced by increased pressure/bulge with standing forward flexion, urinary urgency with standing after prolonged sitting, UI with coughing/laughing/sneezing. Patient's responses on FOTO outcome measures (8) indicate minimal functional limitations/disability/distress. Patient's progress may be limited due to comorbidities; however, patient's motivation and early intervention are advantageous. Patient was able to achieve basic understanding of PFM function during today's evaluation and responded positively to educational interventions. Patient will benefit from continued skilled therapeutic intervention to address deficits in PFM coordination, PFM strength, and IAP management in order to prevent progression of POP, increase function, and improve overall QOL.    Personal Factors and Comorbidities Comorbidity 3+;Age;Education;Behavior Pattern;Time since onset of injury/illness/exacerbation;Past/Current Experience    Comorbidities DM, HTN, arthritis, osteoporosis    Examination-Activity Limitations Lift;Squat;Reach  Overhead;Continence;Transfers    Examination-Participation Restrictions Cleaning;Interpersonal Relationship;Yard Work;Community Activity    Stability/Clinical Decision Making Evolving/Moderate complexity    Clinical Decision Making Moderate    Rehab Potential Fair    PT Frequency 1x / week    PT Duration 8 weeks    PT Treatment/Interventions Spinal Manipulations;Joint Manipulations;Taping;Dry needling;Scar mobilization;Manual techniques;Patient/family education;Therapeutic exercise;Therapeutic activities;Stair training;Balance training;Orthotic Fit/Training;Neuromuscular re-education;Gait training;Electrical Stimulation;Moist Heat;Cryotherapy    PT Next Visit Plan PFM assessment; IAP basics    PT Home Exercise Plan bladder diary    Consulted and Agree with Plan of Care Patient           Patient will benefit from skilled therapeutic intervention in order to improve the following deficits and impairments:  Postural dysfunction, Hypermobility, Decreased strength, Decreased coordination, Decreased activity tolerance, Improper body mechanics, Decreased endurance  Visit Diagnosis: Other lack of coordination  Muscle weakness (generalized)     Problem List There are no problems to display for this patient.  Sheria Lang PT, DPT [phone]  05/07/2020, 10:40  AM  [facility] [address] [place], [place], [address] Phone: [phone]   Fax:  [phone]  Name: Aileana Hodder [facility] MRN: [id number] Date of Birth: November 14, 1970

## 2020-05-13 ENCOUNTER — Other Ambulatory Visit: Payer: Self-pay

## 2020-05-13 ENCOUNTER — Encounter: Payer: Self-pay | Admitting: Physical Therapy

## 2020-05-13 ENCOUNTER — Ambulatory Visit: Payer: 59 | Admitting: Physical Therapy

## 2020-05-13 DIAGNOSIS — R278 Other lack of coordination: Secondary | ICD-10-CM

## 2020-05-13 DIAGNOSIS — M6281 Muscle weakness (generalized): Secondary | ICD-10-CM

## 2020-05-13 NOTE — Therapy (Signed)
[facility] [address] [place], [place], [address] Phone: [phone]   Fax:  [phone]  Physical Therapy Treatment  Patient Details  Name: Nicole Luna [facility] MRN: [id number] Date of Birth: 01/11/1971 Referring Provider (PT): Christeen Douglas   Encounter Date: 05/13/2020   PT End of Session - 05/13/20 1459    Visit Number 2    Number of Visits 8    Date for PT Re-Evaluation 07/01/20    Authorization Type IE 05/06/2020    PT Start Time 1500    PT Stop Time 1555    PT Time Calculation (min) 55 min    Activity Tolerance Patient tolerated treatment well    Behavior During Therapy [facility] for tasks assessed/performed           History reviewed. No pertinent past medical history.  Past Surgical History:  Procedure Laterality Date  . CHOLECYSTECTOMY  2000  . ENDOMETRIAL ABLATION  2007  . KNEE ARTHROSCOPY  1980  . LAPAROSCOPIC GASTRIC BYPASS    . TONSILLECTOMY  07/1998    There were no vitals filed for this visit.   Subjective Assessment - 05/13/20 1500    Subjective Patient presents to clinic with bladder diary. Bladder diary indicates leakage with position changes after prolonged positioning. Patient denies any significant changes since evaluation.    Currently in Pain? No/denies          TREATMENT  Pre-treatment assessment: L IC elevated RANGE OF MOTION:    LEFT RIGHT  Lumbar forward flexion (65):  WNL    Lumbar extension (30): WNL    Lumbar lateral flexion (25):  [facility]  Thoracic and Lumbar rotation (30 degrees):    [facility] WFL  Hip Flexion (0-125):   [facility] WFL  Hip IR (0-45):  [facility] WFL  Hip ER (0-45):  [facility] WFL  Hip Abduction (0-40):  [facility] WFL  Hip extension (0-15):  WFL WFL    STRENGTH: MMT   RLE LLE  Hip Flexion 4 5  Hip Extension 5 4  Hip Abduction  4 4  Hip Adduction  4 4  Hip ER  5 5  Hip IR  5 5  Knee Extension 5 5  Knee Flexion 5 5  Dorsiflexion  5 5  Plantarflexion (seated) 5 5   Neuromuscular  Re-education: Gait with R heel lift in hallway for improved postural awareness Postural re-education/awareness at wall with visual feedback for improved body awareness Seated thoracic extension for improved spinal mobility and postural awareness Seated R mermaid stretch for improved postural awareness Patient educated on log roll technique for improved IAP management with bed mobility.  Patient educated throughout session on appropriate technique and form using multi-modal cueing, HEP, and activity modification. Patient articulated understanding and returned demonstration.  Patient Response to interventions: Comfortable to apply stretches this week  ASSESSMENT Patient presents to clinic with excellent motivation to participate in therapy. Patient demonstrates deficits in PFM coordination, PFM strength, IAP management. Patient able to achieve improved postural awareness with visual feedback and stretches during today's session and responded positively to educational and active interventions. Patient will benefit from continued skilled therapeutic intervention to address remaining deficits in PFM coordination, PFM strength, IAP management in order to increase function and improve overall QOL.   PT Long Term Goals - 05/07/20 1037      PT LONG TERM GOAL #1   Title Patient will demonstrate independence with HEP in order to maximize therapeutic gains and improve carryover from physical  therapy sessions to ADLs in the home and community.    Baseline IE: not initiated    Time 8    Period Weeks    Status New    Target Date 07/01/20      PT LONG TERM GOAL #2   Title Patient will demonstrate independent and coordinated diaphragmatic breathing in supine with a 1:2 breathing pattern for improved down-regulation of the nervous system and improved management of intra-abdominal pressures in order to increase function at home and in the community.    Baseline IE: not demonstrated    Time 8    Period  Weeks    Status New    Target Date 07/01/20      PT LONG TERM GOAL #3   Title Patient will demonstrate improved function as evidenced by a score of 0 on FOTO measure for full participation in activities at home and in the community.    Baseline IE: 8    Time 8    Period Weeks    Status New    Target Date 07/01/20      PT LONG TERM GOAL #4   Title Patient will demonstrate circumferential and sequential contraction of >4/5 MMT, > 6 sec hold x10 and 5 consecutive quick flicks with </= 10 min rest between testing bouts, and relaxation of the PFM coordinated with breath for improved management of intra-abdominal pressure and normal bowel and bladder function without the presence of pain nor incontinence in order to improve participation at home and in the community.    Baseline IE: not demonstrated    Time 8    Period Weeks    Status New    Target Date 07/01/20                 Plan - 05/13/20 1500    Clinical Impression Statement Patient presents to clinic with excellent motivation to participate in therapy. Patient demonstrates deficits in PFM coordination, PFM strength, IAP management. Patient able to achieve improved postural awareness with visual feedback and stretches during today's session and responded positively to educational and active interventions. Patient will benefit from continued skilled therapeutic intervention to address remaining deficits in PFM coordination, PFM strength, IAP management in order to increase function and improve overall QOL.    Personal Factors and Comorbidities Comorbidity 3+;Age;Education;Behavior Pattern;Time since onset of injury/illness/exacerbation;Past/Current Experience    Comorbidities DM, HTN, arthritis, osteoporosis    Examination-Activity Limitations Lift;Squat;Reach Overhead;Continence;Transfers    Examination-Participation Restrictions Cleaning;Interpersonal Relationship;Yard Work;Community Activity    Stability/Clinical Decision Making  Evolving/Moderate complexity    Rehab Potential Fair    PT Frequency 1x / week    PT Duration 8 weeks    PT Treatment/Interventions Spinal Manipulations;Joint Manipulations;Taping;Dry needling;Scar mobilization;Manual techniques;Patient/family education;Therapeutic exercise;Therapeutic activities;Stair training;Balance training;Orthotic Fit/Training;Neuromuscular re-education;Gait training;Electrical Stimulation;Moist Heat;Cryotherapy    PT Next Visit Plan PFM assessment; IAP basics    PT Home Exercise Plan bladder diary    Consulted and Agree with Plan of Care Patient           Patient will benefit from skilled therapeutic intervention in order to improve the following deficits and impairments:  Postural dysfunction, Hypermobility, Decreased strength, Decreased coordination, Decreased activity tolerance, Improper body mechanics, Decreased endurance  Visit Diagnosis: Other lack of coordination  Muscle weakness (generalized)     Problem List There are no problems to display for this patient.  Sheria Lang PT, DPT [phone]  05/13/2020, 6:26 PM  [facility] [address] [place], [place], [address] Phone: [phone]   Fax:  [phone]  Name: Nicole Luna [facility] MRN: [id number] Date of Birth: 25-Dec-1970

## 2020-05-20 ENCOUNTER — Encounter: Payer: Self-pay | Admitting: Physical Therapy

## 2020-05-20 ENCOUNTER — Other Ambulatory Visit: Payer: Self-pay

## 2020-05-20 ENCOUNTER — Ambulatory Visit: Payer: 59 | Admitting: Physical Therapy

## 2020-05-20 DIAGNOSIS — R278 Other lack of coordination: Secondary | ICD-10-CM

## 2020-05-20 DIAGNOSIS — M6281 Muscle weakness (generalized): Secondary | ICD-10-CM

## 2020-05-20 NOTE — Therapy (Signed)
[facility] [address] [place], [place], [address] Phone: [phone]   Fax:  [phone]  Physical Therapy Treatment  Patient Details  Name: Nicole Luna [facility] MRN: [id number] Date of Birth: 1971/05/18 Referring Provider (PT): Christeen Douglas   Encounter Date: 05/20/2020   PT End of Session - 05/20/20 1458    Visit Number 3    Number of Visits 8    Date for PT Re-Evaluation 07/01/20    Authorization Type IE 05/06/2020    PT Start Time 1500    PT Stop Time 1555    PT Time Calculation (min) 55 min    Activity Tolerance Patient tolerated treatment well    Behavior During Therapy [facility] for tasks assessed/performed           History reviewed. No pertinent past medical history.  Past Surgical History:  Procedure Laterality Date   CHOLECYSTECTOMY  2000   ENDOMETRIAL ABLATION  2007   KNEE ARTHROSCOPY  1980   LAPAROSCOPIC GASTRIC BYPASS     TONSILLECTOMY  07/1998    There were no vitals filed for this visit.   Subjective Assessment - 05/20/20 1459    Subjective Patient notes that she feels a little looser in her midback and also notes a sensation of feeling "awake" like she is using muscles. Patient notes UI has been better and cannot recollect any accidents.    Currently in Pain? Yes    Pain Score 0-No pain    Pain Location Back    Pain Orientation Mid    Pain Descriptors / Indicators Aching           TREATMENT  Neuromuscular Re-education: Postural awareness activities  - seated thoracic extension with ball/foam roller/towel roll feedback  - modified down dog at counter for improved thoracic extension  - modified down dog at counter with pelvic tilts and breath coordination  - modified down dog at counter with pelvic tilts (pelvic floor muscle awareness and breath coordination)  - doorway pec stretch with scoliosis correction (RUE at 90, LUE at 30) Patient education on nerve pathways for hypogastric nerve  for improved understanding of impact of posture on bladder function.  Patient educated throughout session on appropriate technique and form using multi-modal cueing, HEP, and activity modification. Patient articulated understanding and returned demonstration.  Patient Response to interventions: Enjoyed stretches  ASSESSMENT Patient presents to clinic with excellent motivation to participate in therapy. Patient demonstrates deficits in PFM coordination, PFM strength, IAP management. Patient achieving good symmetry of spine with RUE bias pec stretch in doorway during today's session and responded positively to educational and active interventions. Patient will benefit from continued skilled therapeutic intervention to address remaining deficits in PFM coordination, PFM strength, IAP management in order to increase function and improve overall QOL.      PT Long Term Goals - 05/07/20 1037      PT LONG TERM GOAL #1   Title Patient will demonstrate independence with HEP in order to maximize therapeutic gains and improve carryover from physical therapy sessions to ADLs in the home and community.    Baseline IE: not initiated    Time 8    Period Weeks    Status New    Target Date 07/01/20      PT LONG TERM GOAL #2   Title Patient will demonstrate independent and coordinated diaphragmatic breathing in supine with a 1:2 breathing pattern for improved down-regulation of the nervous system and improved management of intra-abdominal pressures  in order to increase function at home and in the community.    Baseline IE: not demonstrated    Time 8    Period Weeks    Status New    Target Date 07/01/20      PT LONG TERM GOAL #3   Title Patient will demonstrate improved function as evidenced by a score of 0 on FOTO measure for full participation in activities at home and in the community.    Baseline IE: 8    Time 8    Period Weeks    Status New    Target Date 07/01/20      PT LONG TERM GOAL #4    Title Patient will demonstrate circumferential and sequential contraction of >4/5 MMT, > 6 sec hold x10 and 5 consecutive quick flicks with </= 10 min rest between testing bouts, and relaxation of the PFM coordinated with breath for improved management of intra-abdominal pressure and normal bowel and bladder function without the presence of pain nor incontinence in order to improve participation at home and in the community.    Baseline IE: not demonstrated    Time 8    Period Weeks    Status New    Target Date 07/01/20                 Plan - 05/20/20 1458    Clinical Impression Statement Patient presents to clinic with excellent motivation to participate in therapy. Patient demonstrates deficits in PFM coordination, PFM strength, IAP management. Patient achieving good symmetry of spine with RUE bias pec stretch in doorway during today's session and responded positively to educational and active interventions. Patient will benefit from continued skilled therapeutic intervention to address remaining deficits in PFM coordination, PFM strength, IAP management in order to increase function and improve overall QOL.    Personal Factors and Comorbidities Comorbidity 3+;Age;Education;Behavior Pattern;Time since onset of injury/illness/exacerbation;Past/Current Experience    Comorbidities DM, HTN, arthritis, osteoporosis    Examination-Activity Limitations Lift;Squat;Reach Overhead;Continence;Transfers    Examination-Participation Restrictions Cleaning;Interpersonal Relationship;Yard Work;Community Activity    Stability/Clinical Decision Making Evolving/Moderate complexity    Rehab Potential Fair    PT Frequency 1x / week    PT Duration 8 weeks    PT Treatment/Interventions Spinal Manipulations;Joint Manipulations;Taping;Dry needling;Scar mobilization;Manual techniques;Patient/family education;Therapeutic exercise;Therapeutic activities;Stair training;Balance training;Orthotic  Fit/Training;Neuromuscular re-education;Gait training;Electrical Stimulation;Moist Heat;Cryotherapy    PT Next Visit Plan PFM assessment; IAP basics    PT Home Exercise Plan bladder diary    Consulted and Agree with Plan of Care Patient           Patient will benefit from skilled therapeutic intervention in order to improve the following deficits and impairments:  Postural dysfunction,Hypermobility,Decreased strength,Decreased coordination,Decreased activity tolerance,Improper body mechanics,Decreased endurance  Visit Diagnosis: Other lack of coordination  Muscle weakness (generalized)     Problem List There are no problems to display for this patient.  Sheria Lang PT, DPT [phone]  05/20/2020, 6:07 PM  [facility] [address] [place], [place], [address] Phone: [phone]   Fax:  [phone]  Name: Nicole Luna [facility] MRN: [id number] Date of Birth: [birth year]

## 2020-05-27 ENCOUNTER — Ambulatory Visit: Payer: 59 | Admitting: Physical Therapy

## 2020-05-27 ENCOUNTER — Encounter: Payer: Self-pay | Admitting: Physical Therapy

## 2020-05-27 ENCOUNTER — Other Ambulatory Visit: Payer: Self-pay

## 2020-05-27 DIAGNOSIS — R278 Other lack of coordination: Secondary | ICD-10-CM

## 2020-05-27 DIAGNOSIS — M6281 Muscle weakness (generalized): Secondary | ICD-10-CM

## 2020-05-27 NOTE — Therapy (Signed)
[facility] [address] [place], [place], [address] Phone: [phone]   Fax:  [phone]  Physical Therapy Treatment  Patient Details  Name: Nicole Luna MRN: [id number] Date of Birth: 1971-01-18 Referring Provider (PT): Christeen Douglas   Encounter Date: 05/27/2020   PT End of Session - 05/27/20 1743    Visit Number 4    Number of Visits 8    Date for PT Re-Evaluation 07/01/20    Authorization Type IE 05/06/2020    PT Start Time 1700    PT Stop Time 1755    PT Time Calculation (min) 55 min    Activity Tolerance Patient tolerated treatment well    Behavior During Therapy [facility] for tasks assessed/performed           History reviewed. No pertinent past medical history.  Past Surgical History:  Procedure Laterality Date  . CHOLECYSTECTOMY  2000  . ENDOMETRIAL ABLATION  2007  . KNEE ARTHROSCOPY  1980  . LAPAROSCOPIC GASTRIC BYPASS    . TONSILLECTOMY  07/1998    There were no vitals filed for this visit.   Subjective Assessment - 05/27/20 1701    Subjective Patient notes that she has been enjoying the postural stretches and is working to weave them into her days. Patient did have some UI this morning when stretching into extension while standing.    Currently in Pain? No/denies           TREATMENT  Neuromuscular Re-education: Patient educated extensively on intra-abdominal pressure system and impacts on pressure from, breath patterns, PFM, and core musculature. Patient educated on common times when breath holding occurs: high effort, high thought, high stress, high pain activities. Supine hooklying diaphragmatic breathing with VCs and TCs for downregulation of the nervous system and improved management of IAP Supine hooklying, TrA activation with exhalation. VCs and TCs to decrease compensatory patterns and minimize aggravation of the lumbar paraspinals. Supine hooklying, DRAM reduction with manual  compression and coordinated breath for improved IAP management. "The knack" practice with sit to stand transfers for decreased UI with positional changes.   Patient educated throughout session on appropriate technique and form using multi-modal cueing, HEP, and activity modification. Patient articulated understanding and returned demonstration.  Patient Response to interventions: Comfortable to add DRAM corrective exercises to HEP  ASSESSMENT Patient presents to clinic with excellent motivation to participate in therapy. Patient demonstrates deficits in PFM coordination, PFM strength, IAP management. Patient able to coordinate "the knack" for transfers and completed deep core phase 1 exercises with good form during today's session and responded positively to educational and active interventions. Patient will benefit from continued skilled therapeutic intervention to address remaining deficits in PFM coordination, PFM strength, IAP management in order to increase function and improve overall QOL.       PT Long Term Goals - 05/07/20 1037      PT LONG TERM GOAL #1   Title Patient will demonstrate independence with HEP in order to maximize therapeutic gains and improve carryover from physical therapy sessions to ADLs in the home and community.    Baseline IE: not initiated    Time 8    Period Weeks    Status New    Target Date 07/01/20      PT LONG TERM GOAL #2   Title Patient will demonstrate independent and coordinated diaphragmatic breathing in supine with a 1:2 breathing pattern for improved down-regulation of the nervous system and improved management of intra-abdominal pressures  in order to increase function at home and in the community.    Baseline IE: not demonstrated    Time 8    Period Weeks    Status New    Target Date 07/01/20      PT LONG TERM GOAL #3   Title Patient will demonstrate improved function as evidenced by a score of 0 on FOTO measure for full participation in  activities at home and in the community.    Baseline IE: 8    Time 8    Period Weeks    Status New    Target Date 07/01/20      PT LONG TERM GOAL #4   Title Patient will demonstrate circumferential and sequential contraction of >4/5 MMT, > 6 sec hold x10 and 5 consecutive quick flicks with </= 10 min rest between testing bouts, and relaxation of the PFM coordinated with breath for improved management of intra-abdominal pressure and normal bowel and bladder function without the presence of pain nor incontinence in order to improve participation at home and in the community.    Baseline IE: not demonstrated    Time 8    Period Weeks    Status New    Target Date 07/01/20                 Plan - 05/27/20 1800    Clinical Impression Statement Patient presents to clinic with excellent motivation to participate in therapy. Patient demonstrates deficits in PFM coordination, PFM strength, IAP management. Patient able to coordinate "the knack" for transfers and completed deep core phase 1 exercises with good form during today's session and responded positively to educational and active interventions. Patient will benefit from continued skilled therapeutic intervention to address remaining deficits in PFM coordination, PFM strength, IAP management in order to increase function and improve overall QOL.    Personal Factors and Comorbidities Comorbidity 3+;Age;Education;Behavior Pattern;Time since onset of injury/illness/exacerbation;Past/Current Experience    Comorbidities DM, HTN, arthritis, osteoporosis    Examination-Activity Limitations Lift;Squat;Reach Overhead;Continence;Transfers    Examination-Participation Restrictions Cleaning;Interpersonal Relationship;Yard Work;Community Activity    Stability/Clinical Decision Making Evolving/Moderate complexity    Rehab Potential Fair    PT Frequency 1x / week    PT Duration 8 weeks    PT Treatment/Interventions Spinal Manipulations;Joint  Manipulations;Taping;Dry needling;Scar mobilization;Manual techniques;Patient/family education;Therapeutic exercise;Therapeutic activities;Stair training;Balance training;Orthotic Fit/Training;Neuromuscular re-education;Gait training;Electrical Stimulation;Moist Heat;Cryotherapy    PT Next Visit Plan PFM basics    PT Home Exercise Plan postural stretches, DRAM correction    Consulted and Agree with Plan of Care Patient           Patient will benefit from skilled therapeutic intervention in order to improve the following deficits and impairments:  Postural dysfunction,Hypermobility,Decreased strength,Decreased coordination,Decreased activity tolerance,Improper body mechanics,Decreased endurance  Visit Diagnosis: Other lack of coordination  Muscle weakness (generalized)     Problem List There are no problems to display for this patient.  Sheria Lang PT, DPT [phone]  05/27/2020, 6:07 PM  [facility] [address] [place], [place], [address] Phone: [phone]   Fax:  [phone]  Name: Nicole Luna MRN: [id number] Date of Birth: [birth year]

## 2020-06-03 ENCOUNTER — Encounter: Payer: Self-pay | Admitting: Physical Therapy

## 2020-06-03 ENCOUNTER — Ambulatory Visit: Payer: 59 | Admitting: Physical Therapy

## 2020-06-03 ENCOUNTER — Other Ambulatory Visit: Payer: Self-pay

## 2020-06-03 DIAGNOSIS — R278 Other lack of coordination: Secondary | ICD-10-CM | POA: Diagnosis not present

## 2020-06-03 DIAGNOSIS — M6281 Muscle weakness (generalized): Secondary | ICD-10-CM

## 2020-06-03 NOTE — Therapy (Signed)
[facility] [address] [place], [place], [address] Phone: [phone]   Fax:  [phone]  Physical Therapy Treatment  Patient Details  Name: Nicole Luna [facility] MRN: [id number] Date of Birth: 1970/07/23 Referring Provider (PT): Christeen Douglas   Encounter Date: 06/03/2020   PT End of Session - 06/03/20 1651    Visit Number 5    Number of Visits 8    Date for PT Re-Evaluation 07/01/20    Authorization Type IE 05/06/2020    PT Start Time 1600    PT Stop Time 1655    PT Time Calculation (min) 55 min    Activity Tolerance Patient tolerated treatment well    Behavior During Therapy [facility] for tasks assessed/performed           History reviewed. No pertinent past medical history.  Past Surgical History:  Procedure Laterality Date   CHOLECYSTECTOMY  2000   ENDOMETRIAL ABLATION  2007   KNEE ARTHROSCOPY  1980   LAPAROSCOPIC GASTRIC BYPASS     TONSILLECTOMY  07/1998    There were no vitals filed for this visit.   Subjective Assessment - 06/03/20 1601    Subjective Patient notes that she is still sore posturally but thinks this is a good thing. She is finding herself in stacked posture more often with less thought/focus. Patient notes that work remains high stress and busy, but she is eager to incorporate HEP into her day via work and relaxation time at home.    Currently in Pain? No/denies           TREATMENT  Neuromuscular Re-education: Patient educated extensively on postural impacts to intra-abdominal pressure system.  Standing Pilates Postural Control Hug a Tree, GTB   Serve a Tray, GTB Reverse Hug a Tree, BTB  Reverse Serve a Tray, BTB Seated diaphragmatic breathing with VC and TC PRN    Patient educated throughout session on appropriate technique and form using multi-modal cueing, HEP, and activity modification. Patient articulated understanding and returned demonstration.  Patient Response to  interventions: Patient articulating plan for implementing HEP during workday.  ASSESSMENT Patient presents to clinic with excellent motivation to participate in therapy. Patient demonstrates deficits in PFM coordination, PFM strength, IAP management. Patient with good postural control and proprioception of abdominals with standing Pilates activities during today's session and responded positively to educational and active interventions. Patient will benefit from continued skilled therapeutic intervention to address remaining deficits in PFM coordination, PFM strength, IAP management in order to increase function and improve overall QOL.     PT Long Term Goals - 05/07/20 1037      PT LONG TERM GOAL #1   Title Patient will demonstrate independence with HEP in order to maximize therapeutic gains and improve carryover from physical therapy sessions to ADLs in the home and community.    Baseline IE: not initiated    Time 8    Period Weeks    Status New    Target Date 07/01/20      PT LONG TERM GOAL #2   Title Patient will demonstrate independent and coordinated diaphragmatic breathing in supine with a 1:2 breathing pattern for improved down-regulation of the nervous system and improved management of intra-abdominal pressures in order to increase function at home and in the community.    Baseline IE: not demonstrated    Time 8    Period Weeks    Status New    Target Date 07/01/20  PT LONG TERM GOAL #3   Title Patient will demonstrate improved function as evidenced by a score of 0 on FOTO measure for full participation in activities at home and in the community.    Baseline IE: 8    Time 8    Period Weeks    Status New    Target Date 07/01/20      PT LONG TERM GOAL #4   Title Patient will demonstrate circumferential and sequential contraction of >4/5 MMT, > 6 sec hold x10 and 5 consecutive quick flicks with </= 10 min rest between testing bouts, and relaxation of the PFM coordinated  with breath for improved management of intra-abdominal pressure and normal bowel and bladder function without the presence of pain nor incontinence in order to improve participation at home and in the community.    Baseline IE: not demonstrated    Time 8    Period Weeks    Status New    Target Date 07/01/20                 Plan - 06/03/20 1703    Clinical Impression Statement Patient presents to clinic with excellent motivation to participate in therapy. Patient demonstrates deficits in PFM coordination, PFM strength, IAP management. Patient with good postural control and proprioception of abdominals with standing Pilates activities during today's session and responded positively to educational and active interventions. Patient will benefit from continued skilled therapeutic intervention to address remaining deficits in PFM coordination, PFM strength, IAP management in order to increase function and improve overall QOL.    Personal Factors and Comorbidities Comorbidity 3+;Age;Education;Behavior Pattern;Time since onset of injury/illness/exacerbation;Past/Current Experience    Comorbidities DM, HTN, arthritis, osteoporosis    Examination-Activity Limitations Lift;Squat;Reach Overhead;Continence;Transfers    Examination-Participation Restrictions Cleaning;Interpersonal Relationship;Yard Work;Community Activity    Stability/Clinical Decision Making Evolving/Moderate complexity    Rehab Potential Fair    PT Frequency 1x / week    PT Duration 8 weeks    PT Treatment/Interventions Spinal Manipulations;Joint Manipulations;Taping;Dry needling;Scar mobilization;Manual techniques;Patient/family education;Therapeutic exercise;Therapeutic activities;Stair training;Balance training;Orthotic Fit/Training;Neuromuscular re-education;Gait training;Electrical Stimulation;Moist Heat;Cryotherapy    PT Next Visit Plan PFM basics    PT Home Exercise Plan postural stretches, DRAM correction    Consulted and  Agree with Plan of Care Patient           Patient will benefit from skilled therapeutic intervention in order to improve the following deficits and impairments:  Postural dysfunction,Hypermobility,Decreased strength,Decreased coordination,Decreased activity tolerance,Improper body mechanics,Decreased endurance  Visit Diagnosis: Other lack of coordination  Muscle weakness (generalized)     Problem List There are no problems to display for this patient.  Sheria Lang PT, DPT [phone]  06/03/2020, 5:11 PM  [place] [facility] [address] [place], [place], [address] Phone: [phone]   Fax:  [phone]  Name: Iszabella Hebenstreit [facility] MRN: [id number] Date of Birth: 09/08/1970

## 2020-06-10 ENCOUNTER — Other Ambulatory Visit: Payer: Self-pay

## 2020-06-10 ENCOUNTER — Encounter: Payer: Self-pay | Admitting: Physical Therapy

## 2020-06-10 ENCOUNTER — Ambulatory Visit: Payer: 59 | Attending: Obstetrics and Gynecology | Admitting: Physical Therapy

## 2020-06-10 DIAGNOSIS — R278 Other lack of coordination: Secondary | ICD-10-CM

## 2020-06-10 DIAGNOSIS — M6281 Muscle weakness (generalized): Secondary | ICD-10-CM | POA: Diagnosis present

## 2020-06-11 NOTE — Therapy (Signed)
[facility] [address] [place], [place], [address] Phone: [phone]   Fax:  [phone]  Physical Therapy Treatment  Patient Details  Name: Nicole Luna [facility] MRN: [id number] Date of Birth: 1971/01/24 Referring Provider (PT): Christeen Douglas   Encounter Date: 06/10/2020   PT End of Session - 06/10/20 1720    Visit Number 6    Number of Visits 8    Date for PT Re-Evaluation 07/01/20    Authorization Type IE 05/06/2020    PT Start Time 1700    PT Stop Time 1755    PT Time Calculation (min) 55 min    Activity Tolerance Patient tolerated treatment well    Behavior During Therapy [facility] for tasks assessed/performed           History reviewed. No pertinent past medical history.  Past Surgical History:  Procedure Laterality Date  . CHOLECYSTECTOMY  2000  . ENDOMETRIAL ABLATION  2007  . KNEE ARTHROSCOPY  1980  . LAPAROSCOPIC GASTRIC BYPASS    . TONSILLECTOMY  07/1998    There were no vitals filed for this visit.   Subjective Assessment - 06/10/20 1706    Subjective Patient notes that she had some soreness in her upper abdomen from Pilates standing HEP. Patient states that if she has been sitting in a meeting she will have intense urge on standing, but has not had leakage. She has been drinking 100 oz of water/day.    Currently in Pain? No/denies          TREATMENT  Neuromuscular Re-education: Patient educated on pelvic anatomy for improved understanding of mm function, postural impacts, and PFM proprioception. Supine hooklying diaphragmatic breathing with VCs and TCs for downregulation of the nervous system and improved management of IAP Sidelying, thoracolumbar rotations (open-book) bilaterally with diaphragmatic breathing for improved diaphragmatic and rib cage excursion. VCs and TCs to prevent compensations. Supine hooklying, PFM lengthening with inhalation. VCs and TCs to decrease compensatory patterns and encourage  optimal relaxation of the PFM. Supine hooklying, PFM contractions with exhalation. VCs and TCs to decrease compensatory patterns and encourage activation of the PFM.    Patient educated throughout session on appropriate technique and form using multi-modal cueing, HEP, and activity modification. Patient articulated understanding and returned demonstration.  Patient Response to interventions: Patient comfortable with PFM HEP  ASSESSMENT Patient presents to clinic with excellent motivation to participate in therapy. Patient demonstrates deficits in PFM coordination, PFM strength, IAP management. Patient noted to have quick and coordinated motor control of PFM in supine with strength 3-4/5 MMT for 1 rep max despite decreased awareness of PFM position during today's session and responded positively to educational and active interventions. Patient will benefit from continued skilled therapeutic intervention to address remaining deficits in PFM coordination, PFM strength, IAP management in order to increase function and improve overall QOL.     PT Long Term Goals - 05/07/20 1037      PT LONG TERM GOAL #1   Title Patient will demonstrate independence with HEP in order to maximize therapeutic gains and improve carryover from physical therapy sessions to ADLs in the home and community.    Baseline IE: not initiated    Time 8    Period Weeks    Status New    Target Date 07/01/20      PT LONG TERM GOAL #2   Title Patient will demonstrate independent and coordinated diaphragmatic breathing in supine with a 1:2 breathing pattern for improved down-regulation  of the nervous system and improved management of intra-abdominal pressures in order to increase function at home and in the community.    Baseline IE: not demonstrated    Time 8    Period Weeks    Status New    Target Date 07/01/20      PT LONG TERM GOAL #3   Title Patient will demonstrate improved function as evidenced by a score of 0 on  FOTO measure for full participation in activities at home and in the community.    Baseline IE: 8    Time 8    Period Weeks    Status New    Target Date 07/01/20      PT LONG TERM GOAL #4   Title Patient will demonstrate circumferential and sequential contraction of >4/5 MMT, > 6 sec hold x10 and 5 consecutive quick flicks with </= 10 min rest between testing bouts, and relaxation of the PFM coordinated with breath for improved management of intra-abdominal pressure and normal bowel and bladder function without the presence of pain nor incontinence in order to improve participation at home and in the community.    Baseline IE: not demonstrated    Time 8    Period Weeks    Status New    Target Date 07/01/20                 Plan - 06/10/20 1721    Clinical Impression Statement Patient presents to clinic with excellent motivation to participate in therapy. Patient demonstrates deficits in PFM coordination, PFM strength, IAP management. Patient noted to have quick and coordinated motor control of PFM in supine with strength 3-4/5 MMT for 1 rep max despite decreased awareness of PFM position during today's session and responded positively to educational and active interventions. Patient will benefit from continued skilled therapeutic intervention to address remaining deficits in PFM coordination, PFM strength, IAP management in order to increase function and improve overall QOL.    Personal Factors and Comorbidities Comorbidity 3+;Age;Education;Behavior Pattern;Time since onset of injury/illness/exacerbation;Past/Current Experience    Comorbidities DM, HTN, arthritis, osteoporosis    Examination-Activity Limitations Lift;Squat;Reach Overhead;Continence;Transfers    Examination-Participation Restrictions Cleaning;Interpersonal Relationship;Yard Work;Community Activity    Stability/Clinical Decision Making Evolving/Moderate complexity    Rehab Potential Fair    PT Frequency 1x / week    PT  Duration 8 weeks    PT Treatment/Interventions Spinal Manipulations;Joint Manipulations;Taping;Dry needling;Scar mobilization;Manual techniques;Patient/family education;Therapeutic exercise;Therapeutic activities;Stair training;Balance training;Orthotic Fit/Training;Neuromuscular re-education;Gait training;Electrical Stimulation;Moist Heat;Cryotherapy    PT Next Visit Plan PFM basics    PT Home Exercise Plan postural stretches, DRAM correction    Consulted and Agree with Plan of Care Patient           Patient will benefit from skilled therapeutic intervention in order to improve the following deficits and impairments:  Postural dysfunction,Hypermobility,Decreased strength,Decreased coordination,Decreased activity tolerance,Improper body mechanics,Decreased endurance  Visit Diagnosis: Other lack of coordination  Muscle weakness (generalized)     Problem List There are no problems to display for this patient.  Sheria Lang PT, DPT [phone]  06/11/2020, 12:44 PM   [facility] [address] [place], [place], [address] Phone: [phone]   Fax:  [phone]  Name: Nicole Luna [facility] MRN: [id number] Date of Birth: 21-Nov-1970

## 2020-06-17 ENCOUNTER — Encounter: Payer: Self-pay | Admitting: Physical Therapy

## 2020-06-17 ENCOUNTER — Other Ambulatory Visit: Payer: Self-pay

## 2020-06-17 ENCOUNTER — Ambulatory Visit: Payer: 59 | Admitting: Physical Therapy

## 2020-06-17 DIAGNOSIS — R278 Other lack of coordination: Secondary | ICD-10-CM | POA: Diagnosis not present

## 2020-06-17 DIAGNOSIS — M6281 Muscle weakness (generalized): Secondary | ICD-10-CM

## 2020-06-17 NOTE — Therapy (Signed)
[facility] [address] [place], [place], [address] Phone: [phone]   Fax:  [phone]  Physical Therapy Treatment  Patient Details  Name: Nicole Luna [facility] MRN: [id number] Date of Birth: Aug 28, 1970 Referring Provider (PT): Christeen Douglas   Encounter Date: 06/17/2020   PT End of Session - 06/17/20 1709    Visit Number 7    Number of Visits 8    Date for PT Re-Evaluation 07/01/20    Authorization Type IE 05/06/2020    PT Start Time 1700    PT Stop Time 1755    PT Time Calculation (min) 55 min    Activity Tolerance Patient tolerated treatment well    Behavior During Therapy [facility] for tasks assessed/performed           History reviewed. No pertinent past medical history.  Past Surgical History:  Procedure Laterality Date  . CHOLECYSTECTOMY  2000  . ENDOMETRIAL ABLATION  2007  . KNEE ARTHROSCOPY  1980  . LAPAROSCOPIC GASTRIC BYPASS    . TONSILLECTOMY  07/1998    There were no vitals filed for this visit.   Subjective Assessment - 06/17/20 1703    Subjective Patient notes that things continue to perform exercises and feels things are still improving. She notes she continues to have intense urge upon waking but is largely able to control it in order to get to the toilet. Patient has not had to sit through long meetings so she cannot speak to her ability to suppress urge and control her bladder in that scenario. Patient has lost 12 lbs to date intentionally and feels this is helping with her progress.    Currently in Pain? No/denies           TREATMENT Manual Therapy: Colon massage for improved gut motility with return demonstration.  Neuromuscular Re-education: Supine hooklying diaphragmatic breathing with VCs and TCs for downregulation of the nervous system and improved management of IAP [facility] Supine TrA with coordinated breath Supine heel slides with TrA, coordinated  breath Supine marches with TrA, coordinated breath Supine toe taps with TrA, coordinated breath Supine leg raise with TrA, coordinated breath Countertop birddog progression with coordinated breath  Patient educated throughout session on appropriate technique and form using multi-modal cueing, HEP, and activity modification. Patient articulated understanding and returned demonstration.  Patient Response to interventions: Patient comfortable with additions to HEP  ASSESSMENT Patient presents to clinic with excellent motivation to participate in therapy. Patient demonstrates deficits in PFM coordination, PFM strength, IAP management. Patient able to perform phase 1-2 of abdominal rehabilitation with good form during today's session and responded positively to active interventions. Patient will benefit from continued skilled therapeutic intervention to address remaining deficits in PFM coordination, PFM strength, IAP management in order to increase function and improve overall QOL.      PT Long Term Goals - 05/07/20 1037      PT LONG TERM GOAL #1   Title Patient will demonstrate independence with HEP in order to maximize therapeutic gains and improve carryover from physical therapy sessions to ADLs in the home and community.    Baseline IE: not initiated    Time 8    Period Weeks    Status New    Target Date 07/01/20      PT LONG TERM GOAL #2   Title Patient will demonstrate independent and coordinated diaphragmatic breathing in supine with a 1:2 breathing pattern for improved down-regulation of the nervous system and improved  management of intra-abdominal pressures in order to increase function at home and in the community.    Baseline IE: not demonstrated    Time 8    Period Weeks    Status New    Target Date 07/01/20      PT LONG TERM GOAL #3   Title Patient will demonstrate improved function as evidenced by a score of 0 on FOTO measure for full participation in activities at home  and in the community.    Baseline IE: 8    Time 8    Period Weeks    Status New    Target Date 07/01/20      PT LONG TERM GOAL #4   Title Patient will demonstrate circumferential and sequential contraction of >4/5 MMT, > 6 sec hold x10 and 5 consecutive quick flicks with </= 10 min rest between testing bouts, and relaxation of the PFM coordinated with breath for improved management of intra-abdominal pressure and normal bowel and bladder function without the presence of pain nor incontinence in order to improve participation at home and in the community.    Baseline IE: not demonstrated    Time 8    Period Weeks    Status New    Target Date 07/01/20                 Plan - 06/17/20 1710    Clinical Impression Statement Patient presents to clinic with excellent motivation to participate in therapy. Patient demonstrates deficits in PFM coordination, PFM strength, IAP management. Patient able to perform phase 1-2 of abdominal rehabilitation with good form during today's session and responded positively to active interventions. Patient will benefit from continued skilled therapeutic intervention to address remaining deficits in PFM coordination, PFM strength, IAP management in order to increase function and improve overall QOL.    Personal Factors and Comorbidities Comorbidity 3+;Age;Education;Behavior Pattern;Time since onset of injury/illness/exacerbation;Past/Current Experience    Comorbidities DM, HTN, arthritis, osteoporosis    Examination-Activity Limitations Lift;Squat;Reach Overhead;Continence;Transfers    Examination-Participation Restrictions Cleaning;Interpersonal Relationship;Yard Work;Community Activity    Stability/Clinical Decision Making Evolving/Moderate complexity    Rehab Potential Fair    PT Frequency 1x / week    PT Duration 8 weeks    PT Treatment/Interventions Spinal Manipulations;Joint Manipulations;Taping;Dry needling;Scar mobilization;Manual  techniques;Patient/family education;Therapeutic exercise;Therapeutic activities;Stair training;Balance training;Orthotic Fit/Training;Neuromuscular re-education;Gait training;Electrical Stimulation;Moist Heat;Cryotherapy    PT Next Visit Plan PFM basics    PT Home Exercise Plan postural stretches, DRAM correction    Consulted and Agree with Plan of Care Patient           Patient will benefit from skilled therapeutic intervention in order to improve the following deficits and impairments:  Postural dysfunction,Hypermobility,Decreased strength,Decreased coordination,Decreased activity tolerance,Improper body mechanics,Decreased endurance  Visit Diagnosis: Other lack of coordination  Muscle weakness (generalized)     Problem List There are no problems to display for this patient.  Sheria Lang PT, DPT [phone]  06/17/2020, 6:28 PM  [place] [facility] [address] [place], [place], [address] Phone: [phone]   Fax:  [phone]  Name: Nicole Luna [facility] MRN: [id number] Date of Birth: August 29, 1970

## 2020-06-24 ENCOUNTER — Encounter: Payer: 59 | Admitting: Physical Therapy

## 2020-07-01 ENCOUNTER — Ambulatory Visit: Payer: 59 | Admitting: Physical Therapy

## 2020-07-01 ENCOUNTER — Other Ambulatory Visit: Payer: Self-pay

## 2020-07-01 ENCOUNTER — Encounter: Payer: Self-pay | Admitting: Physical Therapy

## 2020-07-01 DIAGNOSIS — R278 Other lack of coordination: Secondary | ICD-10-CM

## 2020-07-01 DIAGNOSIS — M6281 Muscle weakness (generalized): Secondary | ICD-10-CM

## 2020-07-01 NOTE — Therapy (Signed)
[place] [facility] [address] [place], [place], [address] Phone: [phone]   Fax:  [phone]  Physical Therapy Treatment/Discharge  Patient Details  Name: Nicole Luna [facility] MRN: [id number] Date of Birth: [birth year] Referring Provider (PT): Benjaman Kindler   Encounter Date: 07/01/2020   PT End of Session - 07/01/20 1712    Visit Number 8    Number of Visits 8    Date for PT Re-Evaluation 07/01/20    Authorization Type IE 05/06/2020    PT Start Time 1700    PT Stop Time 1755    PT Time Calculation (min) 55 min    Activity Tolerance Patient tolerated treatment well    Behavior During Therapy [facility] for tasks assessed/performed           History reviewed. No pertinent past medical history.  Past Surgical History:  Procedure Laterality Date  . CHOLECYSTECTOMY  2000  . ENDOMETRIAL ABLATION  2007  . KNEE ARTHROSCOPY  1980  . LAPAROSCOPIC GASTRIC BYPASS    . TONSILLECTOMY  07/1998    There were no vitals filed for this visit.   Subjective Assessment - 07/01/20 1705    Subjective Patient notes that she has had busy day travelling for work. Patient reports that she had to be on her feet for work for a week and had NO incidents of UI. Patient has had to sit in meetings (2 hour) and did not have any UI. Patient also notes that she is not being awakened by a stong urge to urinate and is not leaking upon waking.    Currently in Pain? No/denies           TREATMENT Neuromuscular Re-education: Supine hooklying diaphragmatic breathing with VCs and TCs for downregulation of the nervous system and improved management of IAP Supine hooklying, PFM lengthening with inhalation. VCs and TCs to decrease compensatory patterns and encourage optimal relaxation of the PFM. Supine hooklying, PFM contractions (endurance 7 sec x3) with exhalation. VCs and TCs to decrease compensatory patterns and encourage activation of the PFM. Reassessed goals;  see below.    Patient educated throughout session on appropriate technique and form using multi-modal cueing, HEP, and activity modification. Patient articulated understanding and returned demonstration.  Patient Response to interventions: Patient comfortable with additions to HEP  ASSESSMENT Patient presents to clinic with excellent motivation to participate in therapy. Patient demonstrates minimal remaining deficits in PFM coordination, PFM strength, IAP management. Patient has achieved 3 of 4 goals set forth in therapy and demonstrates competence to continue strengthening of PFM for achievement of goal 4. Patient is appropriate for discharge to self-management for maintenance/continued progress with respect to PFM coordination, PFM strength, IAP management in order to continue increasing function and improving overall QOL.     PT Long Term Goals - 07/01/20 1717      PT LONG TERM GOAL #1   Title Patient will demonstrate independence with HEP in order to maximize therapeutic gains and improve carryover from physical therapy sessions to ADLs in the home and community.    Baseline IE: not initiated; 1/26: IND    Time 8    Period Weeks    Status Achieved      PT LONG TERM GOAL #2   Title Patient will demonstrate independent and coordinated diaphragmatic breathing in supine with a 1:2 breathing pattern for improved down-regulation of the nervous system and improved management of intra-abdominal pressures in order to increase function at home and in the community.  Baseline IE: not demonstrated; 1/26: IND    Time 8    Period Weeks    Status New      PT LONG TERM GOAL #3   Title Patient will demonstrate improved function as evidenced by a score of 0 on FOTO measure for full participation in activities at home and in the community.    Baseline IE: 8; 1/26: 0    Time 8    Period Weeks    Status Achieved      PT LONG TERM GOAL #4   Title Patient will demonstrate circumferential and  sequential contraction of >4/5 MMT, > 6 sec hold x10 and 5 consecutive quick flicks with </= 10 min rest between testing bouts, and relaxation of the PFM coordinated with breath for improved management of intra-abdominal pressure and normal bowel and bladder function without the presence of pain nor incontinence in order to improve participation at home and in the community.    Baseline IE: not demonstrated; 1/26: 4/5 MMT, 7 sec hold x3, 5 consecutive quick flicks (2 min rest)    Time 8    Period Weeks    Status Partially Met                 Plan - 07/01/20 1713    Clinical Impression Statement Patient presents to clinic with excellent motivation to participate in therapy. Patient demonstrates minimal remaining deficits in PFM coordination, PFM strength, IAP management. Patient has achieved 3 of 4 goals set forth in therapy and demonstrates competence to continue strengthening of PFM for achievement of goal 4. Patient is appropriate for discharge to self-management for maintenance/continued progress with respect to PFM coordination, PFM strength, IAP management in order to continue increasing function and improving overall QOL.    Personal Factors and Comorbidities Comorbidity 3+;Age;Education;Behavior Pattern;Time since onset of injury/illness/exacerbation;Past/Current Experience    Comorbidities DM, HTN, arthritis, osteoporosis    Examination-Activity Limitations Lift;Squat;Reach Overhead;Continence;Transfers    Examination-Participation Restrictions Cleaning;Interpersonal Relationship;Yard Work;Community Activity    Stability/Clinical Decision Making Evolving/Moderate complexity    Rehab Potential Fair    PT Frequency 1x / week    PT Duration 8 weeks    PT Treatment/Interventions Spinal Manipulations;Joint Manipulations;Taping;Dry needling;Scar mobilization;Manual techniques;Patient/family education;Therapeutic exercise;Therapeutic activities;Stair training;Balance training;Orthotic  Fit/Training;Neuromuscular re-education;Gait training;Electrical Stimulation;Moist Heat;Cryotherapy    PT Next Visit Plan PFM basics    PT Home Exercise Plan postural stretches, DRAM correction    Consulted and Agree with Plan of Care Patient           Patient will benefit from skilled therapeutic intervention in order to improve the following deficits and impairments:  Postural dysfunction,Hypermobility,Decreased strength,Decreased coordination,Decreased activity tolerance,Improper body mechanics,Decreased endurance  Visit Diagnosis: Other lack of coordination  Muscle weakness (generalized)     Problem List There are no problems to display for this patient.  Myles Gip PT, DPT [phone]  07/01/2020, 6:29 PM  [facility] [address] [place], [place], [address] Phone: [phone]   Fax:  [phone]  Name: Nicole Luna [facility] MRN: [id number] Date of Birth: 1971/01/26

## 2023-11-08 ENCOUNTER — Ambulatory Visit (INDEPENDENT_AMBULATORY_CARE_PROVIDER_SITE_OTHER): Payer: Self-pay

## 2023-11-08 DIAGNOSIS — D127 Benign neoplasm of rectosigmoid junction: Secondary | ICD-10-CM | POA: Diagnosis not present

## 2023-11-08 DIAGNOSIS — Z1211 Encounter for screening for malignant neoplasm of colon: Secondary | ICD-10-CM | POA: Diagnosis present

## 2023-11-08 DIAGNOSIS — K640 First degree hemorrhoids: Secondary | ICD-10-CM | POA: Diagnosis not present

## 2023-11-08 DIAGNOSIS — Z860101 Personal history of adenomatous and serrated colon polyps: Secondary | ICD-10-CM | POA: Diagnosis not present

## 2023-11-08 DIAGNOSIS — D120 Benign neoplasm of cecum: Secondary | ICD-10-CM | POA: Diagnosis not present

## 2023-11-08 DIAGNOSIS — K5730 Diverticulosis of large intestine without perforation or abscess without bleeding: Secondary | ICD-10-CM | POA: Diagnosis not present

## 2023-11-10 ENCOUNTER — Ambulatory Visit
Admission: RE | Admit: 2023-11-10 | Discharge: 2023-11-10 | Disposition: A | Discharge status: Home / Self Care | Source: Ambulatory Visit | Attending: Physician Assistant | Admitting: Physician Assistant

## 2023-11-10 ENCOUNTER — Other Ambulatory Visit: Payer: Self-pay | Admitting: Physician Assistant

## 2023-11-10 DIAGNOSIS — R109 Unspecified abdominal pain: Secondary | ICD-10-CM

## 2023-11-10 DIAGNOSIS — R319 Hematuria, unspecified: Secondary | ICD-10-CM
